# Patient Record
Sex: Female | Born: 1955 | Race: White | Hispanic: No | Marital: Single | Smoking: Never smoker
Health system: Southern US, Community
[De-identification: ages and names within clinical notes are randomized; demographics above are authoritative.]

## PROBLEM LIST (undated history)

## (undated) DIAGNOSIS — F419 Anxiety disorder, unspecified: Secondary | ICD-10-CM

## (undated) DIAGNOSIS — J45909 Unspecified asthma, uncomplicated: Secondary | ICD-10-CM

## (undated) DIAGNOSIS — E782 Mixed hyperlipidemia: Secondary | ICD-10-CM

## (undated) DIAGNOSIS — M810 Age-related osteoporosis without current pathological fracture: Secondary | ICD-10-CM

## (undated) DIAGNOSIS — E559 Vitamin D deficiency, unspecified: Secondary | ICD-10-CM

## (undated) DIAGNOSIS — N183 Chronic kidney disease, stage 3 unspecified: Secondary | ICD-10-CM

## (undated) DIAGNOSIS — E119 Type 2 diabetes mellitus without complications: Secondary | ICD-10-CM

## (undated) DIAGNOSIS — I10 Essential (primary) hypertension: Secondary | ICD-10-CM

## (undated) DIAGNOSIS — N393 Stress incontinence (female) (male): Secondary | ICD-10-CM

## (undated) DIAGNOSIS — K219 Gastro-esophageal reflux disease without esophagitis: Secondary | ICD-10-CM

## (undated) DIAGNOSIS — F32A Depression, unspecified: Secondary | ICD-10-CM

## (undated) DIAGNOSIS — F329 Major depressive disorder, single episode, unspecified: Secondary | ICD-10-CM

## (undated) HISTORY — DX: Vitamin D deficiency, unspecified: E55.9

## (undated) HISTORY — DX: Stress incontinence (female) (male): N39.3

## (undated) HISTORY — PX: MASTECTOMY: SHX3

## (undated) HISTORY — DX: Age-related osteoporosis without current pathological fracture: M81.0

## (undated) HISTORY — PX: BREAST LUMPECTOMY: SHX2

## (undated) HISTORY — DX: Essential (primary) hypertension: I10

## (undated) HISTORY — DX: Gastro-esophageal reflux disease without esophagitis: K21.9

## (undated) HISTORY — DX: Chronic kidney disease, stage 3 unspecified: N18.30

## (undated) HISTORY — DX: Unspecified asthma, uncomplicated: J45.909

## (undated) HISTORY — DX: Depression, unspecified: F32.A

## (undated) HISTORY — DX: Chronic kidney disease, stage 3 (moderate): N18.3

## (undated) HISTORY — DX: Type 2 diabetes mellitus without complications: E11.9

## (undated) HISTORY — DX: Major depressive disorder, single episode, unspecified: F32.9

## (undated) HISTORY — DX: Mixed hyperlipidemia: E78.2

## (undated) HISTORY — DX: Anxiety disorder, unspecified: F41.9

---

## 1998-03-12 ENCOUNTER — Other Ambulatory Visit: Admission: RE | Admit: 1998-03-12 | Discharge: 1998-03-12 | Payer: Self-pay | Admitting: Obstetrics and Gynecology

## 1998-03-28 ENCOUNTER — Other Ambulatory Visit: Admission: RE | Admit: 1998-03-28 | Discharge: 1998-03-28 | Payer: Self-pay | Admitting: Hematology and Oncology

## 1998-04-29 ENCOUNTER — Ambulatory Visit (HOSPITAL_COMMUNITY): Admission: RE | Admit: 1998-04-29 | Discharge: 1998-04-29 | Payer: Self-pay | Admitting: Obstetrics and Gynecology

## 1999-06-27 ENCOUNTER — Other Ambulatory Visit: Admission: RE | Admit: 1999-06-27 | Discharge: 1999-06-27 | Payer: Self-pay | Admitting: Family Medicine

## 1999-08-27 ENCOUNTER — Encounter: Payer: Self-pay | Admitting: Hematology and Oncology

## 1999-08-27 ENCOUNTER — Encounter: Admission: RE | Admit: 1999-08-27 | Discharge: 1999-08-27 | Payer: Self-pay | Admitting: Hematology and Oncology

## 2000-06-22 ENCOUNTER — Encounter: Admission: RE | Admit: 2000-06-22 | Discharge: 2000-06-22 | Payer: Self-pay | Admitting: Hematology and Oncology

## 2000-06-22 ENCOUNTER — Encounter: Payer: Self-pay | Admitting: Hematology and Oncology

## 2000-08-13 ENCOUNTER — Other Ambulatory Visit: Admission: RE | Admit: 2000-08-13 | Discharge: 2000-08-13 | Payer: Self-pay | Admitting: Family Medicine

## 2001-08-15 ENCOUNTER — Other Ambulatory Visit: Admission: RE | Admit: 2001-08-15 | Discharge: 2001-08-15 | Payer: Self-pay | Admitting: Family Medicine

## 2001-08-23 ENCOUNTER — Encounter: Payer: Self-pay | Admitting: Hematology and Oncology

## 2001-08-23 ENCOUNTER — Ambulatory Visit (HOSPITAL_COMMUNITY): Admission: RE | Admit: 2001-08-23 | Discharge: 2001-08-23 | Payer: Self-pay | Admitting: Hematology and Oncology

## 2007-06-28 ENCOUNTER — Other Ambulatory Visit: Payer: Self-pay

## 2007-06-29 ENCOUNTER — Inpatient Hospital Stay (HOSPITAL_COMMUNITY): Admission: AD | Admit: 2007-06-29 | Discharge: 2007-07-04 | Payer: Self-pay | Admitting: Psychiatry

## 2007-06-29 ENCOUNTER — Ambulatory Visit: Payer: Self-pay | Admitting: Psychiatry

## 2011-03-06 NOTE — Discharge Summary (Signed)
NAMELESLEA, VOWLES NO.:  [id number]   MEDICAL RECORD NO.:  [id number]          PATIENT TYPE:  IPS   LOCATION:  0402                          FACILITY:  [facility]   PHYSICIAN:  Anselm Jungling, MD  DATE OF BIRTH:  1955/12/08   DATE OF ADMISSION:  06/28/2007  DATE OF DISCHARGE:  07/04/2007                               DISCHARGE SUMMARY   IDENTIFYING DATA/REASON FOR ADMISSION:  This was an inpatient  psychiatric admission for Tyquisha, a 55 year old unmarried woman referred  by her therapist, in the emergency department.  She came to [place] with a  previous diagnosis of bipolar disorder, and possibly some  developmentally based cognitive deficiencies as well.  She was admitted  due to the suicidal and homicidal threats, and complaints of auditory  hallucinations.  She had been living in a particular group home in the  [place] area.  She came to [place] on a regimen of Celexa, Depakote,  trazodone, and Klonopin.  Dr. Cheree Ditto is her usual psychiatrist at [facility].  Please refer to the admission note for further  details pertaining to the symptoms, circumstances and history that led  to her hospitalization.   INITIAL DIAGNOSTIC IMPRESSION:  She was given initial AXIS I diagnoses  of psychosis not otherwise specified, mood disorder not otherwise  specified, rule out bipolar disorder.   MEDICAL/LABORATORY:  The patient was medically and physically assessed  by the psychiatric nurse practitioner.  She came to [place] with a history of  hypertension, hypercholesterolemia, and history of breast carcinoma.  She came to [place] on numerous nonpsychotropic medications and was continued  on these (see below).  There were no acute medical issues during her  stay.   HOSPITAL COURSE:  The patient was admitted to the adult inpatient  psychiatric service.  She presented as a well-nourished, well-developed  woman who was alert, fully oriented, pleasant, open, and quite  talkative.   She was a fairly good historian.  Her mood appeared neutral  with appropriate affect.  She denied any suicidal ideation in the  initial interview.  She made no overtly delusional statements.  She  described auditory and visual hallucinations, but did not appear to be  responding to internal stimuli.  She also complained of insomnia.   The patient was involved in therapeutic groups and activities, and  continued on her usual regimen of Depakote, Klonopin, and Risperdal.  Trazodone was used at bedtime to assist with sleep.   In looking into her group home situation, it appeared that she had had  various difficulties and conflicts with various staff and residents that  had led her to have the particular crisis that led to her hospital stay.  It did not appear that her mental status as we were seeing it was  significantly different from her baseline.   She was generally pleasant and cooperative throughout her inpatient  stay.  The only medication changes that were made involved moving her  divided doses of Depakote and Klonopin to bedtime, to improve her sleep.  Risperdal 1 mg in the evening was added  to address her complaints of  hallucinations.  The patient appeared to have more difficulty with these  sorts of things in the evening and at night time, as if sundowning,  and it was felt that low-dose Risperdal in the evening would be useful  to her.  Indeed, it did appear to be helpful.  The patient stated after  this was introduced that the medication helped my sleep and  everything.  She denied any further auditory hallucinations.   The casemanager worked closely with the patient and staff at her  previous group home towards a plan for her return there.  She appeared  appropriate for return on the seventh hospital day.   AFTERCARE:  The patient was to follow up at [facility]  with an appointment on July 05, 2007 at 9 a.m.   DISCHARGE MEDICATIONS:  1. Norvasc 2.5 mg  daily.  2. Os-Cal 500 mg t.i.d.  3. __________ 1 gram b.i.d.  4. Protonix 40 mg daily.  5. Celexa 20 mg t.i.d.  6. Nasonex 2 sprays daily.  7. Trazodone 50 mg q.h.s.  8. Welchol 625 mg b.i.d.  9. Xopenex inhaler as needed.  10.Zetia 10 mg daily.  11.VESIcare 10 mg daily.  12.Fosamax 70 mg weekly, next on July 07, 2007.  13.Depakote ER 750 mg at bed.  14.Klonopin 1 mg q.h.s.  15.Risperdal 1 mg q.p.m.   DISCHARGE DIAGNOSES:  AXIS I:  Bipolar disorder by history, currently  euthymic without psychotic features.  AXIS II:  Rule out developmental disability.  AXIS III:  History of hypertension, hypercholesterolemia, nasal  allergies, gastroesophageal reflux disease.  AXIS IV:  Stressors:  Severe.  AXIS V:  GAF on discharge 50.      Anselm Jungling, MD  Electronically Signed     SPB/MEDQ  D:  07/05/2007  T:  07/05/2007  Job:  [id number]

## 2011-07-31 LAB — BASIC METABOLIC PANEL
BUN: 8
CO2: 28
Calcium: 9.6
Chloride: 103
Creatinine, Ser: 0.72
GFR calc Af Amer: >60
GFR calc non Af Amer: >60
Glucose, Bld: 123 — ABNORMAL HIGH
Potassium: 3.8
Sodium: 137

## 2011-07-31 LAB — DIFFERENTIAL
Basophils Absolute: 0
Basophils Relative: 0
Eosinophils Absolute: 0.1
Eosinophils Relative: 2
Lymphocytes Relative: 43
Lymphs Abs: 2.7
Monocytes Absolute: 0.5
Monocytes Relative: 8
Neutro Abs: 3
Neutrophils Relative %: 47

## 2011-07-31 LAB — RAPID URINE DRUG SCREEN, HOSP PERFORMED
Amphetamines: NOT DETECTED
Barbiturates: NOT DETECTED
Benzodiazepines: NOT DETECTED
Cocaine: NOT DETECTED
Opiates: NOT DETECTED
Tetrahydrocannabinol: NOT DETECTED

## 2011-07-31 LAB — CBC
HCT: 36.3
Hemoglobin: 12.7
MCHC: 35
MCV: 91.2
Platelets: 128 — ABNORMAL LOW
RBC: 3.99
RDW: 12.6
WBC: 6.3

## 2011-07-31 LAB — URINE MICROSCOPIC-ADD ON

## 2011-07-31 LAB — URINALYSIS, ROUTINE W REFLEX MICROSCOPIC
Bilirubin Urine: NEGATIVE
Glucose, UA: NEGATIVE
Hgb urine dipstick: NEGATIVE
Ketones, ur: NEGATIVE
Nitrite: NEGATIVE
Protein, ur: NEGATIVE
Specific Gravity, Urine: 1.008
Urobilinogen, UA: 0.2
pH: 6.5

## 2011-07-31 LAB — VALPROIC ACID LEVEL
Valproic Acid Lvl: 65.1
Valproic Acid Lvl: 94.3

## 2011-07-31 LAB — ETHANOL: Alcohol, Ethyl (B): <5

## 2016-10-26 ENCOUNTER — Ambulatory Visit: Payer: Medicare Other | Admitting: Neurology

## 2016-11-12 ENCOUNTER — Ambulatory Visit (INDEPENDENT_AMBULATORY_CARE_PROVIDER_SITE_OTHER): Payer: Medicare Other | Admitting: Neurology

## 2016-11-12 ENCOUNTER — Encounter: Payer: Self-pay | Admitting: Neurology

## 2016-11-12 VITALS — BP 110/64 | HR 90 | Resp 16 | Ht 63.0 in | Wt 163.0 lb

## 2016-11-12 DIAGNOSIS — G2119 Other drug induced secondary parkinsonism: Secondary | ICD-10-CM

## 2016-11-12 DIAGNOSIS — R251 Tremor, unspecified: Secondary | ICD-10-CM | POA: Diagnosis not present

## 2016-11-12 DIAGNOSIS — R2689 Other abnormalities of gait and mobility: Secondary | ICD-10-CM | POA: Diagnosis not present

## 2016-11-12 DIAGNOSIS — M25561 Pain in right knee: Secondary | ICD-10-CM | POA: Diagnosis not present

## 2016-11-12 DIAGNOSIS — M25562 Pain in left knee: Secondary | ICD-10-CM

## 2016-11-12 NOTE — Progress Notes (Signed)
Subjective:    Patient ID: Donna Shepherd is a 60 y.o. female.  HPI     Huston Foley, MD, PhD [facility] [address], [address] [address] [place], [place] [address]  Dear Dr. Katrinka Blazing,  I saw your patient, Donna Shepherd, upon your kind request in my neurologic clinic today for initial consultation of her parkinsonism. The patient is accompanied by a caregiver from her group home today. As you know, Donna Shepherd is a 61 year old right-handed woman with an underlying medical history of mood disorder for which she is followed by psychiatry, hyperlipidemia, vitamin D deficiency, prediabetes, osteoporosis, reflux disease, allergic rhinitis, depression, anxiety, asthma, chronic kidney disease, hypothyroidism, and overweight state, who has a history of upper extremity tremors for several years. She carries a diagnosis of Parkinson's disease or parkinsonism according to your records. She has been on amantadine for this. Apparently she has been on amantadine for years. She's not able to provide her own history. Her caretaker has known her for about a year. She has previously removed from a different group home in [facility]. She has a brother in town who does not have a history of tremor and she does not report a family history of Parkinson's disease. She has a seventh grade education. She has no children. She does not smoke or drink alcohol. Of note, she is on multiple medications including psychotropic medications. She is on Trileptal, clonazepam, high-dose Seroquel, loxapine, Topamax, amantadine 100 mg twice daily, I reviewed her entire medication list in the folder that her caretaker brought today.  I also reviewed your office note from 09/23/2016, which you kindly included. Recent blood work through your office from 08/13/2016 was reviewed:  Free T4 was 0.85, borderline reduced, T3 was 40.48, reduced, CMP was unremarkable with the exception of creatinine at 1.3, CBC with differential  was unremarkable with the exception of borderline MCV at 102, magnesium level was unremarkable, urinalysis negative. She reports some falls. She has balance issues, tremors affect both hands and both arms, also her face. She also endorses bilateral knee pain, right more than left, per caretaker, she does not straighten out her right leg completely.  Her Past Medical History Is Significant For: Past Medical History:  Diagnosis Date  . Anxiety   . Asthma   . CKD (chronic kidney disease) stage 3, GFR 30-59 ml/min   . Depressive disorder   . Diabetes mellitus without complication (HCC)   . GERD (gastroesophageal reflux disease)   . Hypertension   . Mixed dyslipidemia   . Osteoporosis   . SUI (stress urinary incontinence, female)   . Vitamin D deficiency     Her Past Surgical History Is Significant For: Past Surgical History:  Procedure Laterality Date  . BREAST LUMPECTOMY Right   . MASTECTOMY Left     Her Family History Is Significant For: Family History  Problem Relation Age of Onset  . Diabetes Mother   . Heart disease Mother   . Hyperlipidemia Mother   . Hypertension Mother   . Depression Father   . Diabetes Father   . Heart disease Father   . Hyperlipidemia Father   . Hypertension Father     Her Social History Is Significant For: Social History   Social History  . Marital status: Single    Spouse name: N/A  . Number of children: 0  . Years of education: 7   Social History Main Topics  . Smoking status: Never Smoker  . Smokeless tobacco: Never  Used  . Alcohol use No  . Drug use: No  . Sexual activity: Not Asked   Other Topics Concern  . None   Social History Narrative   Drinks 1 soda about 3 times a week.     Her Allergies Are:  Allergies  Allergen Reactions  . Codeine   . Morphine And Related Rash  . Sulfa Antibiotics Rash  :   Her Current Medications Are:  Outpatient Encounter Prescriptions as of 11/12/2016  Medication Sig  . albuterol  (PROVENTIL HFA;VENTOLIN HFA) 108 (90 Base) MCG/ACT inhaler Inhale into the lungs every 6 (six) hours as needed for wheezing or shortness of breath.  Marland Kitchen. alendronate (FOSAMAX) 35 MG tablet Take 35 mg by mouth every 7 (seven) days. Take with a full glass of water on an empty stomach.  Marland Kitchen. amantadine (SYMMETREL) 100 MG capsule Take 100 mg by mouth 2 (two) times daily.  Marland Kitchen. aspirin 81 MG tablet Take 81 mg by mouth daily.  Marland Kitchen. CALCIUM-VITAMIN D PO Take by mouth.  . cholecalciferol (VITAMIN D) 1000 units tablet Take 1,000 Units by mouth daily.  . clonazePAM (KLONOPIN) 0.5 MG tablet Take 0.5 mg by mouth. Take 1 tab in the morning and 2 tabs at night  . diltiazem (TIAZAC) 120 MG 24 hr capsule Take 120 mg by mouth daily.  Marland Kitchen. FLUoxetine (PROZAC) 20 MG capsule Take 40 mg by mouth daily.  Marland Kitchen. ibuprofen (ADVIL,MOTRIN) 600 MG tablet Take 600 mg by mouth 3 (three) times daily.  Marland Kitchen. levothyroxine (SYNTHROID, LEVOTHROID) 25 MCG tablet Take 25 mcg by mouth daily before breakfast.  . linaclotide (LINZESS) 290 MCG CAPS capsule Take 290 mcg by mouth daily before breakfast.  . loxapine (LOXITANE) 10 MG capsule Take 10 mg by mouth 2 (two) times daily.  Marland Kitchen. lubiprostone (AMITIZA) 8 MCG capsule Take 8 mcg by mouth 2 (two) times daily with a meal.  . mirabegron ER (MYRBETRIQ) 25 MG TB24 tablet Take 25 mg by mouth daily.  . nitrofurantoin, macrocrystal-monohydrate, (MACROBID) 100 MG capsule Take 100 mg by mouth daily.  . Oxcarbazepine (TRILEPTAL) 300 MG tablet Take 450 mg by mouth 2 (two) times daily.  . QUEtiapine (SEROQUEL) 100 MG tablet Take 100 mg by mouth. Take 1 tab in the morning, 1 tab in the afternoon, and 3 tab at bedtime.  . topiramate (TOPAMAX) 25 MG tablet Take 25 mg by mouth 2 (two) times daily.  Marland Kitchen. trimethoprim (TRIMPEX) 100 MG tablet Take 100 mg by mouth daily.   No facility-administered encounter medications on file as of 11/12/2016.   :   Review of Systems:  Out of a complete 14 point review of systems, all are  reviewed and negative with the exception of these symptoms as listed below:  Review of Systems  Neurological:       Caregiver states that patient has had increased trouble with balance and gait. Notice tremors in arms and hands that have progressed in the last year.     Objective:  Neurologic Exam  Physical Exam Physical Examination:   Vitals:   11/12/16 1013  BP: 110/64  Pulse: 90  Resp: 16    General Examination: The patient is a very pleasant 61 y.o. female in no acute distress, She is anxious appearing. She has difficulty following commands, she can mimic better than follow simple commands.  HEENT: Normocephalic, atraumatic, pupils are equal, round and reactive to light and accommodation. Funduscopic exam is not possible as she does not keep her eyes still. She has  no clear nystagmus but does have difficulty tracking. Face is symmetric to perhaps mildly masked, normal facial sensation is noted. Hearing appears to be intact. She has a softer voice. She has no voice tremor. She has an intermittent lower jaw and lip tremor. She has no significant nuchal rigidity. Oropharynx exam reveals severe mouth dryness, tongue protrudes centrally and palate elevates symmetrically.   Chest: Clear to auscultation without wheezing, rhonchi or crackles noted.  Heart: S1+S2+0, regular and normal without murmurs, rubs or gallops noted.   Abdomen: Soft, non-tender and non-distended with normal bowel sounds appreciated on auscultation.  Extremities: There is trace pitting edema in the distal lower extremities bilaterally.   Skin:   Is dry, no other abnormalities noted.   Musculoskeletal: exam reveals: Bilateral knee pain, right more than left, she has decreased in range of motion bilaterally in the right leg more than left. She cannot fully extend her right leg. She has tenderness on palpation in the medial aspect of the right more than left knee.   Neurologically:  Mental status: The patient is  awake, alert and oriented in all 2 spheres.her memory, attention, language and fund of knowledge are abnormal. She's not able to give her own history. She is not sure how long she has had tremors and balance problems. Mood and affect are difficult to assess. She is cooperative with the exam but is not able to follow more than simple commands. Cranial nerves II - XII are as described above under HEENT exam. In addition: shoulder shrug is normal with equal shoulder height noted. Motor exam: Normal bulk, global strength is 5 out of 5, tone is very mildly increased in the upper extremities. She has a mild degree of intermittent resting tremor in both upper extremities, she has a mild to moderate postural tremor in both upper extremities and mild action tremor in both upper extremities. She has no lower extremity tremor. She has impaired fine motor skills throughout but these are difficult to assess as she cannot follow complex commands. She stands with difficulty and has knee pain. She does not push through both knees, right side is worse than left. She walks with small steps, posture is mild to moderately stooped, she has decrease in arm swing bilaterally. Reflexes are 1-2+ throughout. Balance is mildly impaired. She turns in 3 steps. Sensory exam: intact to light touch and otherwise not fully reliable.   Assessment and Plan:   Assessment and Plan:  In summary, TRESIA REVOLORIO is a very pleasant 61 y.o.-year old female with an underlying Complex medical history of mood disorder for which she is followed by psychiatry, hyperlipidemia, vitamin D deficiency, prediabetes, osteoporosis, reflux disease, allergic rhinitis, depression, anxiety, asthma, chronic kidney disease, hypothyroidism, and overweight state, who presents for consultation of her history of parkinsonism. On examination she has mild parkinsonism, versus tremor, likely drug-induced in her case. She has no one-sided lateralization and no classic  history for Parkinson's disease. I had a long discussion with the patient and particularly her caretaker today explaining my findings and the fact that patient is on multiple psychotropic medication as well as high-dose Seroquel which is a likely contributor to her parkinsonism. In the past, she may have been on lithium per caretaker but she is not fully sure. She has been on Topamax for tremor control as I understand and also on amantadine for parkinsonism. I'm not sure that this is beneficial for her at this time especially since amantadine can cause significant side effects including dry  mouth, hallucinations, balance problems, blurry vision, and urinary problems. Per caretaker, patient has occasional hallucinations. On examination she does have a significantly dry mouth. I would not recommend any additional medication trials of medications at this time and if anything I would recommend that she be tapered off of amantadine. She is encouraged to discuss this with you. She can also discuss this with her psychiatrist. She has no other focal neurological findings, she is advised to stay well-hydrated and change positions slowly. she has bilateral knee pain, right more than left and has some tenderness on palpation of her right knee. She is encouraged to discuss with you the possibility of seeing an orthopedic doctor for this. From my end of things I can see her back on an as-needed basis. I answered all her questions today and the patient and her caretaker were in agreement. Thank you very much for allowing me to participate in the care of this nice patient. If I can be of any further assistance to you please do not hesitate to call me at [phone].  Sincerely,   Huston Foley, MD, PhD

## 2016-11-12 NOTE — Patient Instructions (Signed)
Your tremors and parkinsonian symptoms may be secondary to medication effect. I would not recommend any additional medication for management of your tremor or parkinsonism. I would like for you to discuss with your primary care physician or psychiatrist whether he could taper off the amantadine. This medication can cause side effects including dry mouth, hallucinations, balance problems, all of which you are currently experiencing.  For your knee pain I recommend that you seek consultation with an orthopedic surgeon, ask your primary care physician about a referral.

## 2017-04-15 ENCOUNTER — Other Ambulatory Visit: Payer: Self-pay | Admitting: Internal Medicine

## 2017-04-15 DIAGNOSIS — Z1231 Encounter for screening mammogram for malignant neoplasm of breast: Secondary | ICD-10-CM

## 2018-07-07 ENCOUNTER — Ambulatory Visit: Payer: Medicare Other | Admitting: Neurology

## 2018-07-08 ENCOUNTER — Encounter: Payer: Self-pay | Admitting: Neurology

## 2018-08-04 ENCOUNTER — Other Ambulatory Visit: Payer: Self-pay | Admitting: Sports Medicine

## 2018-08-04 DIAGNOSIS — S92324A Nondisplaced fracture of second metatarsal bone, right foot, initial encounter for closed fracture: Secondary | ICD-10-CM

## 2018-08-08 ENCOUNTER — Other Ambulatory Visit: Payer: Self-pay

## 2018-08-29 ENCOUNTER — Other Ambulatory Visit: Payer: Self-pay

## 2018-08-31 ENCOUNTER — Other Ambulatory Visit: Payer: Self-pay

## 2018-09-01 ENCOUNTER — Ambulatory Visit
Admission: RE | Admit: 2018-09-01 | Discharge: 2018-09-01 | Disposition: A | Discharge status: Home / Self Care | Payer: Medicare Other | Source: Ambulatory Visit | Attending: Sports Medicine | Admitting: Sports Medicine

## 2018-09-01 DIAGNOSIS — S92324A Nondisplaced fracture of second metatarsal bone, right foot, initial encounter for closed fracture: Secondary | ICD-10-CM

## 2019-02-06 ENCOUNTER — Other Ambulatory Visit: Payer: Self-pay | Admitting: Physician Assistant

## 2019-02-06 DIAGNOSIS — Z1231 Encounter for screening mammogram for malignant neoplasm of breast: Secondary | ICD-10-CM

## 2019-04-12 ENCOUNTER — Ambulatory Visit
Admission: RE | Admit: 2019-04-12 | Discharge: 2019-04-12 | Disposition: A | Discharge status: Home / Self Care | Payer: Medicare Other | Source: Ambulatory Visit | Attending: Physician Assistant | Admitting: Physician Assistant

## 2019-04-12 ENCOUNTER — Other Ambulatory Visit: Payer: Self-pay

## 2019-04-12 DIAGNOSIS — Z1231 Encounter for screening mammogram for malignant neoplasm of breast: Secondary | ICD-10-CM

## 2019-09-13 ENCOUNTER — Emergency Department (HOSPITAL_COMMUNITY): Payer: Medicare Other

## 2019-09-13 ENCOUNTER — Other Ambulatory Visit: Payer: Self-pay

## 2019-09-13 ENCOUNTER — Observation Stay (HOSPITAL_COMMUNITY)
Admission: EM | Admit: 2019-09-13 | Discharge: 2019-09-14 | Disposition: A | Discharge status: Skilled Nursing Facility | Payer: Medicare Other | Attending: Neurosurgery | Admitting: Neurosurgery

## 2019-09-13 ENCOUNTER — Encounter (HOSPITAL_COMMUNITY): Payer: Self-pay | Admitting: Emergency Medicine

## 2019-09-13 DIAGNOSIS — F329 Major depressive disorder, single episode, unspecified: Secondary | ICD-10-CM | POA: Insufficient documentation

## 2019-09-13 DIAGNOSIS — R410 Disorientation, unspecified: Secondary | ICD-10-CM | POA: Diagnosis not present

## 2019-09-13 DIAGNOSIS — Z7982 Long term (current) use of aspirin: Secondary | ICD-10-CM | POA: Insufficient documentation

## 2019-09-13 DIAGNOSIS — M810 Age-related osteoporosis without current pathological fracture: Secondary | ICD-10-CM | POA: Diagnosis not present

## 2019-09-13 DIAGNOSIS — I129 Hypertensive chronic kidney disease with stage 1 through stage 4 chronic kidney disease, or unspecified chronic kidney disease: Secondary | ICD-10-CM | POA: Insufficient documentation

## 2019-09-13 DIAGNOSIS — W010XXA Fall on same level from slipping, tripping and stumbling without subsequent striking against object, initial encounter: Secondary | ICD-10-CM | POA: Diagnosis not present

## 2019-09-13 DIAGNOSIS — F39 Unspecified mood [affective] disorder: Secondary | ICD-10-CM | POA: Insufficient documentation

## 2019-09-13 DIAGNOSIS — W19XXXA Unspecified fall, initial encounter: Secondary | ICD-10-CM | POA: Diagnosis not present

## 2019-09-13 DIAGNOSIS — K219 Gastro-esophageal reflux disease without esophagitis: Secondary | ICD-10-CM | POA: Diagnosis not present

## 2019-09-13 DIAGNOSIS — F419 Anxiety disorder, unspecified: Secondary | ICD-10-CM | POA: Diagnosis not present

## 2019-09-13 DIAGNOSIS — E782 Mixed hyperlipidemia: Secondary | ICD-10-CM | POA: Diagnosis not present

## 2019-09-13 DIAGNOSIS — E785 Hyperlipidemia, unspecified: Secondary | ICD-10-CM | POA: Insufficient documentation

## 2019-09-13 DIAGNOSIS — J45909 Unspecified asthma, uncomplicated: Secondary | ICD-10-CM | POA: Insufficient documentation

## 2019-09-13 DIAGNOSIS — E1122 Type 2 diabetes mellitus with diabetic chronic kidney disease: Secondary | ICD-10-CM | POA: Diagnosis not present

## 2019-09-13 DIAGNOSIS — F0390 Unspecified dementia without behavioral disturbance: Secondary | ICD-10-CM | POA: Diagnosis not present

## 2019-09-13 DIAGNOSIS — S0081XA Abrasion of other part of head, initial encounter: Secondary | ICD-10-CM | POA: Diagnosis not present

## 2019-09-13 DIAGNOSIS — N183 Chronic kidney disease, stage 3 unspecified: Secondary | ICD-10-CM | POA: Insufficient documentation

## 2019-09-13 DIAGNOSIS — S062X0A Diffuse traumatic brain injury without loss of consciousness, initial encounter: Secondary | ICD-10-CM

## 2019-09-13 DIAGNOSIS — Z20828 Contact with and (suspected) exposure to other viral communicable diseases: Secondary | ICD-10-CM | POA: Diagnosis not present

## 2019-09-13 DIAGNOSIS — Z882 Allergy status to sulfonamides status: Secondary | ICD-10-CM | POA: Insufficient documentation

## 2019-09-13 DIAGNOSIS — N393 Stress incontinence (female) (male): Secondary | ICD-10-CM | POA: Diagnosis not present

## 2019-09-13 DIAGNOSIS — Z8249 Family history of ischemic heart disease and other diseases of the circulatory system: Secondary | ICD-10-CM | POA: Insufficient documentation

## 2019-09-13 DIAGNOSIS — Z79899 Other long term (current) drug therapy: Secondary | ICD-10-CM | POA: Diagnosis not present

## 2019-09-13 DIAGNOSIS — E559 Vitamin D deficiency, unspecified: Secondary | ICD-10-CM | POA: Insufficient documentation

## 2019-09-13 DIAGNOSIS — Z791 Long term (current) use of non-steroidal anti-inflammatories (NSAID): Secondary | ICD-10-CM | POA: Diagnosis not present

## 2019-09-13 DIAGNOSIS — Z833 Family history of diabetes mellitus: Secondary | ICD-10-CM | POA: Insufficient documentation

## 2019-09-13 DIAGNOSIS — S06300A Unspecified focal traumatic brain injury without loss of consciousness, initial encounter: Principal | ICD-10-CM | POA: Insufficient documentation

## 2019-09-13 DIAGNOSIS — S0636AA Traumatic hemorrhage of cerebrum, unspecified, with loss of consciousness status unknown, initial encounter: Secondary | ICD-10-CM | POA: Diagnosis present

## 2019-09-13 DIAGNOSIS — R519 Headache, unspecified: Secondary | ICD-10-CM | POA: Insufficient documentation

## 2019-09-13 DIAGNOSIS — Z885 Allergy status to narcotic agent status: Secondary | ICD-10-CM | POA: Insufficient documentation

## 2019-09-13 DIAGNOSIS — S06369A Traumatic hemorrhage of cerebrum, unspecified, with loss of consciousness of unspecified duration, initial encounter: Secondary | ICD-10-CM | POA: Diagnosis present

## 2019-09-13 LAB — COMPREHENSIVE METABOLIC PANEL
ALT: 40 U/L (ref 0–44)
AST: 31 U/L (ref 15–41)
Albumin: 4.4 g/dL (ref 3.5–5.0)
Alkaline Phosphatase: 71 U/L (ref 38–126)
Anion gap: 11 (ref 5–15)
BUN: 13 mg/dL (ref 8–23)
CO2: 20 mmol/L — ABNORMAL LOW (ref 22–32)
Calcium: 9.9 mg/dL (ref 8.9–10.3)
Chloride: 105 mmol/L (ref 98–111)
Creatinine, Ser: 1.4 mg/dL — ABNORMAL HIGH (ref 0.44–1.00)
GFR calc Af Amer: 46 mL/min — ABNORMAL LOW (ref >60)
GFR calc non Af Amer: 40 mL/min — ABNORMAL LOW (ref >60)
Glucose, Bld: 130 mg/dL — ABNORMAL HIGH (ref 70–99)
Potassium: 4.3 mmol/L (ref 3.5–5.1)
Sodium: 136 mmol/L (ref 135–145)
Total Bilirubin: 0.6 mg/dL (ref 0.3–1.2)
Total Protein: 7.2 g/dL (ref 6.5–8.1)

## 2019-09-13 LAB — CBC WITH DIFFERENTIAL/PLATELET
Abs Immature Granulocytes: 0.05 10*3/uL (ref 0.00–0.07)
Basophils Absolute: 0 10*3/uL (ref 0.0–0.1)
Basophils Relative: 0 %
Eosinophils Absolute: 0 10*3/uL (ref 0.0–0.5)
Eosinophils Relative: 0 %
HCT: 38.3 % (ref 36.0–46.0)
Hemoglobin: 12.6 g/dL (ref 12.0–15.0)
Immature Granulocytes: 1 %
Lymphocytes Relative: 10 %
Lymphs Abs: 1.1 10*3/uL (ref 0.7–4.0)
MCH: 33.2 pg (ref 26.0–34.0)
MCHC: 32.9 g/dL (ref 30.0–36.0)
MCV: 101.1 fL — ABNORMAL HIGH (ref 80.0–100.0)
Monocytes Absolute: 0.9 10*3/uL (ref 0.1–1.0)
Monocytes Relative: 9 %
Neutro Abs: 8.5 10*3/uL — ABNORMAL HIGH (ref 1.7–7.7)
Neutrophils Relative %: 80 %
Platelets: 140 10*3/uL — ABNORMAL LOW (ref 150–400)
RBC: 3.79 MIL/uL — ABNORMAL LOW (ref 3.87–5.11)
RDW: 13.2 % (ref 11.5–15.5)
WBC: 10.6 10*3/uL — ABNORMAL HIGH (ref 4.0–10.5)
nRBC: 0 % (ref 0.0–0.2)

## 2019-09-13 MED ORDER — TETANUS-DIPHTH-ACELL PERTUSSIS 5-2.5-18.5 LF-MCG/0.5 IM SUSP
0.5000 mL | Freq: Once | INTRAMUSCULAR | Status: AC
  Administered 2019-09-13: 0.5 mL via INTRAMUSCULAR
  Filled 2019-09-13: qty 0.5

## 2019-09-13 MED ORDER — FLUOXETINE HCL 20 MG PO CAPS
40.0000 mg | ORAL_CAPSULE | Freq: Every day | ORAL | Status: DC
  Administered 2019-09-14: 40 mg via ORAL
  Filled 2019-09-13: qty 2

## 2019-09-13 MED ORDER — ATORVASTATIN CALCIUM 10 MG PO TABS: 20.0000 mg | ORAL_TABLET | Freq: Every day | ORAL | Status: DC

## 2019-09-13 MED ORDER — ACETAMINOPHEN 500 MG PO TABS: 500.0000 mg | ORAL_TABLET | Freq: Three times a day (TID) | ORAL | Status: DC | PRN

## 2019-09-13 MED ORDER — SODIUM CHLORIDE 0.9 % IV SOLN
INTRAVENOUS | Status: DC
  Administered 2019-09-14: 04:00:00 via INTRAVENOUS
  Filled 2019-09-13 (×4): qty 1000

## 2019-09-13 MED ORDER — DILTIAZEM HCL ER COATED BEADS 120 MG PO CP24
120.0000 mg | ORAL_CAPSULE | Freq: Every day | ORAL | Status: DC
  Administered 2019-09-14: 120 mg via ORAL
  Filled 2019-09-13: qty 1

## 2019-09-13 MED ORDER — ALBUTEROL SULFATE (2.5 MG/3ML) 0.083% IN NEBU: 3.0000 mL | INHALATION_SOLUTION | Freq: Four times a day (QID) | RESPIRATORY_TRACT | Status: DC | PRN

## 2019-09-13 MED ORDER — LEVOTHYROXINE SODIUM 25 MCG PO TABS
25.0000 ug | ORAL_TABLET | Freq: Every day | ORAL | Status: DC
  Filled 2019-09-13: qty 1

## 2019-09-13 MED ORDER — QUETIAPINE FUMARATE 100 MG PO TABS
100.0000 mg | ORAL_TABLET | Freq: Three times a day (TID) | ORAL | Status: DC
  Administered 2019-09-14: 100 mg via ORAL
  Filled 2019-09-13: qty 1

## 2019-09-13 MED ORDER — TRAMADOL HCL 50 MG PO TABS: 50.0000 mg | ORAL_TABLET | Freq: Four times a day (QID) | ORAL | Status: DC | PRN

## 2019-09-13 MED ORDER — ACETAMINOPHEN 325 MG PO TABS
650.0000 mg | ORAL_TABLET | Freq: Once | ORAL | Status: AC
  Administered 2019-09-13: 19:00:00 650 mg via ORAL
  Filled 2019-09-13: qty 2

## 2019-09-13 MED ORDER — ONDANSETRON HCL 4 MG PO TABS: 4.0000 mg | ORAL_TABLET | Freq: Four times a day (QID) | ORAL | Status: DC | PRN

## 2019-09-13 MED ORDER — ONDANSETRON HCL 4 MG/2ML IJ SOLN: 4.0000 mg | Freq: Four times a day (QID) | INTRAMUSCULAR | Status: DC | PRN

## 2019-09-13 NOTE — ED Notes (Signed)
[facility] contacted and paperwork printed  

## 2019-09-13 NOTE — ED Triage Notes (Signed)
Patient BIB caregiver from assisted living facility, reports trip and fall hitting head on cement. Abrasion and bruising noted to left forehead. Baseline per caregiver. Denies taking blood thinners.

## 2019-09-13 NOTE — ED Notes (Signed)
Patient transported to X-ray 

## 2019-09-13 NOTE — ED Notes (Signed)
ED TO INPATIENT HANDOFF REPORT  Name/Age/Gender Donna Shepherd 63 y.o. female  Code Status    Code Status Orders  (From admission, onward)         Start     Ordered   09/13/19 2146  Full code  Continuous     09/13/19 2149        Code Status History    This patient has a current code status but no historical code status.   Advance Care Planning Activity      Home/SNF/Other Home  Chief Complaint Fall, Head LAc  Level of Care/Admitting Diagnosis ED Disposition    ED Disposition Condition Comment   Admit  Hospital Area: [facility] [[id number]]  Level of Care: Med-Surg [16]  Covid Evaluation: Asymptomatic Screening Protocol (No Symptoms)  Diagnosis: Traumatic intracerebral hemorrhage [facility]) [[id number]]  Admitting Physician: Coletta Memos [[id number]]  Attending Physician: Coletta Memos [[id number]]  Bed request comments: 4np  PT Class (Do Not Modify): Observation [104]  PT Acc Code (Do Not Modify): Observation [10022]       Medical History Past Medical History:  Diagnosis Date  . Anxiety   . Asthma   . CKD (chronic kidney disease) stage 3, GFR 30-59 ml/min   . Depressive disorder   . Diabetes mellitus without complication (HCC)   . GERD (gastroesophageal reflux disease)   . Hypertension   . Mixed dyslipidemia   . Osteoporosis   . SUI (stress urinary incontinence, female)   . Vitamin D deficiency     Allergies Allergies  Allergen Reactions  . Codeine   . Morphine And Related Rash  . Sulfa Antibiotics Rash    IV Location/Drains/Wounds Patient Lines/Drains/Airways Status   Active Line/Drains/Airways    Name:   Placement date:   Placement time:   Site:   Days:   Peripheral IV 09/13/19 Right Antecubital   09/13/19    1940    Antecubital   less than 1   Peripheral IV 09/13/19 Right Forearm   09/13/19    1946    Forearm   less than 1          Labs/Imaging Results for orders placed or performed during the hospital encounter of 09/13/19 (from the  past 48 hour(s))  CBC with Differential     Status: Abnormal   Collection Time: 09/13/19  7:45 PM  Result Value Ref Range   WBC 10.6 (H) 4.0 - 10.5 K/uL   RBC 3.79 (L) 3.87 - 5.11 MIL/uL   Hemoglobin 12.6 12.0 - 15.0 g/dL   HCT 30.8 65.7 - 84.6 %   MCV 101.1 (H) 80.0 - 100.0 fL   MCH 33.2 26.0 - 34.0 pg   MCHC 32.9 30.0 - 36.0 g/dL   RDW 96.2 95.2 - 84.1 %   Platelets 140 (L) 150 - 400 K/uL    Comment: SPECIMEN CHECKED FOR CLOTS   nRBC 0.0 0.0 - 0.2 %   Neutrophils Relative % 80 %   Neutro Abs 8.5 (H) 1.7 - 7.7 K/uL   Lymphocytes Relative 10 %   Lymphs Abs 1.1 0.7 - 4.0 K/uL   Monocytes Relative 9 %   Monocytes Absolute 0.9 0.1 - 1.0 K/uL   Eosinophils Relative 0 %   Eosinophils Absolute 0.0 0.0 - 0.5 K/uL   Basophils Relative 0 %   Basophils Absolute 0.0 0.0 - 0.1 K/uL   Immature Granulocytes 1 %   Abs Immature Granulocytes 0.05 0.00 - 0.07 K/uL    Comment: Performed  at [facility], [address] [place], [place] [address]  Comprehensive metabolic panel     Status: Abnormal   Collection Time: 09/13/19  7:45 PM  Result Value Ref Range   Sodium 136 135 - 145 mmol/L   Potassium 4.3 3.5 - 5.1 mmol/L   Chloride 105 98 - 111 mmol/L   CO2 20 (L) 22 - 32 mmol/L   Glucose, Bld 130 (H) 70 - 99 mg/dL   BUN 13 8 - 23 mg/dL   Creatinine, Ser 6.571.40 (H) 0.44 - 1.00 mg/dL   Calcium 9.9 8.9 - 84.610.3 mg/dL   Total Protein 7.2 6.5 - 8.1 g/dL   Albumin 4.4 3.5 - 5.0 g/dL   AST 31 15 - 41 U/L   ALT 40 0 - 44 U/L   Alkaline Phosphatase 71 38 - 126 U/L   Total Bilirubin 0.6 0.3 - 1.2 mg/dL   GFR calc non Af Amer 40 (L) >60 mL/min   GFR calc Af Amer 46 (L) >60 mL/min   Anion gap 11 5 - 15    Comment: Performed at [facility], [address] [place], [place] [address]   Dg Chest 2 View  Result Date: 09/13/2019 CLINICAL DATA:  Fall, confusion EXAM: CHEST - 2 VIEW COMPARISON:  05/16/2004 chest radiograph. FINDINGS: Surgical clips throughout the left  breast. Stable cardiomediastinal silhouette with normal heart size. No pneumothorax. No pleural effusion. Minimal platelike scarring versus atelectasis in lower left lung. No pulmonary edema. No acute consolidative airspace disease. Sclerotic lesion in the proximal right femoral shaft is stable since 2005 radiographs, considered benign. No displaced fractures in the visualized chest. IMPRESSION: Minimal platelike scarring versus atelectasis in the lower left lung. Otherwise no active cardiopulmonary disease. Electronically Signed   By: Delbert PhenixJason A Poff M.D.   On: 09/13/2019 16:58   Dg Pelvis 1-2 Views  Result Date: 09/13/2019 CLINICAL DATA:  Patient tripped and fell. Abrasion and bruising noted over the left forehead. EXAM: PELVIS - 1-2 VIEW COMPARISON:  None. FINDINGS: The bones appear mildly demineralized. There is no evidence of acute pelvic fracture or sacroiliac joint diastasis. The left hip is externally rotated. No definite proximal femur fracture or dislocation. The soft tissues appear unremarkable. IMPRESSION: No acute osseous findings demonstrated within the pelvis. The left hip is externally rotated and suboptimally evaluated. Electronically Signed   By: Carey BullocksWilliam  Veazey M.D.   On: 09/13/2019 16:58   Ct Head Wo Contrast  Result Date: 09/13/2019 CLINICAL DATA:  Head trauma, headache.  Fall. EXAM: CT HEAD WITHOUT CONTRAST CT CERVICAL SPINE WITHOUT CONTRAST TECHNIQUE: Multidetector CT imaging of the head and cervical spine was performed following the standard protocol without intravenous contrast. Multiplanar CT image reconstructions of the cervical spine were also generated. COMPARISON:  Radiographs of the cervical spine 08/16/2004 FINDINGS: CT HEAD FINDINGS Brain: There is a 7 mm hemorrhagic parenchymal contusion within the anterior left frontal lobe (series 3, image 24) (series 6, image 22). No demarcated cortical infarction. No evidence of intracranial mass. No midline shift or extra-axial fluid  collection. Mild scattered ill-defined hypoattenuation within the cerebral white matter is nonspecific, but consistent with chronic small vessel ischemic disease. Cerebral volume is normal for age. Vascular: No hyperdense vessel.  Atherosclerotic calcifications. Skull: Normal. Negative for fracture or focal lesion. Sinuses/Orbits: Visualized orbits demonstrate no acute abnormality. Minimal mucosal thickening within the inferior right maxillary sinus. Other: Prominent left frontal scalp to left maxillofacial hematoma. CT CERVICAL SPINE FINDINGS Alignment: Straightening of the expected  cervical lordosis. Skull base and vertebrae: The basion-dental and atlanto-dental intervals are maintained.No evidence of acute fracture to the cervical spine. Soft tissues and spinal canal: No prevertebral fluid or swelling. No visible canal hematoma. Disc levels: C6-C7 posterior disc osteophyte. No high-grade bony spinal canal stenosis at any level. Upper chest: No consolidation within the imaged lung apices. No visible pneumothorax. These results were called by telephone at the time of interpretation on 09/13/2019 at 7:24 pm to provider [facility] , who verbally acknowledged these results. IMPRESSION: Head CT: 1. 7 mm acute hemorrhagic parenchymal contusion within the anterior left frontal lobe. 2. Prominent left frontal scalp to left maxillofacial hematoma. 3. Mild chronic small vessel ischemic disease. Cervical Spine CT: No evidence of acute fracture to the cervical spine. Electronically Signed   By: Kellie Simmering DO   On: 09/13/2019 19:25   Ct Cervical Spine Wo Contrast  Result Date: 09/13/2019 CLINICAL DATA:  Head trauma, headache.  Fall. EXAM: CT HEAD WITHOUT CONTRAST CT CERVICAL SPINE WITHOUT CONTRAST TECHNIQUE: Multidetector CT imaging of the head and cervical spine was performed following the standard protocol without intravenous contrast. Multiplanar CT image reconstructions of the cervical spine were also  generated. COMPARISON:  Radiographs of the cervical spine 08/16/2004 FINDINGS: CT HEAD FINDINGS Brain: There is a 7 mm hemorrhagic parenchymal contusion within the anterior left frontal lobe (series 3, image 24) (series 6, image 22). No demarcated cortical infarction. No evidence of intracranial mass. No midline shift or extra-axial fluid collection. Mild scattered ill-defined hypoattenuation within the cerebral white matter is nonspecific, but consistent with chronic small vessel ischemic disease. Cerebral volume is normal for age. Vascular: No hyperdense vessel.  Atherosclerotic calcifications. Skull: Normal. Negative for fracture or focal lesion. Sinuses/Orbits: Visualized orbits demonstrate no acute abnormality. Minimal mucosal thickening within the inferior right maxillary sinus. Other: Prominent left frontal scalp to left maxillofacial hematoma. CT CERVICAL SPINE FINDINGS Alignment: Straightening of the expected cervical lordosis. Skull base and vertebrae: The basion-dental and atlanto-dental intervals are maintained.No evidence of acute fracture to the cervical spine. Soft tissues and spinal canal: No prevertebral fluid or swelling. No visible canal hematoma. Disc levels: C6-C7 posterior disc osteophyte. No high-grade bony spinal canal stenosis at any level. Upper chest: No consolidation within the imaged lung apices. No visible pneumothorax. These results were called by telephone at the time of interpretation on 09/13/2019 at 7:24 pm to provider [facility] , who verbally acknowledged these results. IMPRESSION: Head CT: 1. 7 mm acute hemorrhagic parenchymal contusion within the anterior left frontal lobe. 2. Prominent left frontal scalp to left maxillofacial hematoma. 3. Mild chronic small vessel ischemic disease. Cervical Spine CT: No evidence of acute fracture to the cervical spine. Electronically Signed   By: Kellie Simmering DO   On: 09/13/2019 19:25   Dg Hip Unilat W Or Wo Pelvis 2-3 Views  Left  Result Date: 09/13/2019 CLINICAL DATA:  Fall EXAM: DG HIP (WITH OR WITHOUT PELVIS) 2-3V LEFT COMPARISON:  None. FINDINGS: No fracture or dislocation is seen. The left hip joint space is preserved. Visualized bony pelvis appears intact. IMPRESSION: Negative. Electronically Signed   By: Julian Hy M.D.   On: 09/13/2019 19:04    Pending Labs Unresulted Labs (From admission, onward)    Start     Ordered   09/13/19 2146  HIV Antibody (routine testing w rflx)  (HIV Antibody (Routine testing w reflex) panel)  Once,   STAT     09/13/19 2149   09/13/19 1956  SARS  CORONAVIRUS 2 (TAT 6-24 HRS) Nasopharyngeal Nasopharyngeal Swab  (Asymptomatic/Tier 3)  Once,   STAT    Question Answer Comment  Is this test for diagnosis or screening Screening   Symptomatic for COVID-19 as defined by CDC No   Hospitalized for COVID-19 No   Admitted to ICU for COVID-19 No   Previously tested for COVID-19 No   Resident in a congregate (group) care setting Yes   Employed in healthcare setting No   Pregnant No      09/13/19 1956          Vitals/Pain Today's Vitals   09/13/19 1605 09/13/19 1609 09/13/19 1948 09/13/19 2125  BP: 125/70  (!) 142/84 122/66  Pulse: 69  74 68  Resp: 20  (!) 24 18  Temp:  98 F (36.7 C)    TempSrc:  Oral    SpO2: 100%  100% 100%  Weight: 69.4 kg     Height: 5' 5.5" (1.664 m)       Isolation Precautions No active isolations  Medications Medications  acetaminophen (TYLENOL) tablet 500 mg (has no administration in time range)  traMADol (ULTRAM) tablet 50 mg (has no administration in time range)  atorvastatin (LIPITOR) tablet 20 mg (has no administration in time range)  diltiazem (TIAZAC) 24 hr capsule 120 mg (has no administration in time range)  FLUoxetine (PROZAC) capsule 40 mg (has no administration in time range)  QUEtiapine (SEROQUEL) tablet 100 mg (has no administration in time range)  levothyroxine (SYNTHROID) tablet 25 mcg (has no administration in time  range)  albuterol (VENTOLIN HFA) 108 (90 Base) MCG/ACT inhaler 2 puff (has no administration in time range)  sodium chloride 0.9 % 1,000 mL with potassium chloride 10 mEq infusion (has no administration in time range)  ondansetron (ZOFRAN) tablet 4 mg (has no administration in time range)    Or  ondansetron (ZOFRAN) injection 4 mg (has no administration in time range)  acetaminophen (TYLENOL) tablet 650 mg (650 mg Oral Given 09/13/19 1854)  Tdap (BOOSTRIX) injection 0.5 mL (0.5 mLs Intramuscular Given 09/13/19 1855)    Mobility manual wheelchair

## 2019-09-13 NOTE — ED Notes (Signed)
Patient transported to CT 

## 2019-09-13 NOTE — H&P (Signed)
Donna Shepherd is an 63 y.o. female.   Chief Complaint: ich, post traumatic HPI: Donna Shepherd was in her usual state of health today, when while at an adult day care center fell striking her head on the left side. This caused a large amount of swelling and a small abrasion near the left eye. CT showed a very small ich and I was called for further management.  Past Medical History:  Diagnosis Date  . Anxiety   . Asthma   . CKD (chronic kidney disease) stage 3, GFR 30-59 ml/min   . Depressive disorder   . Diabetes mellitus without complication (HCC)   . GERD (gastroesophageal reflux disease)   . Hypertension   . Mixed dyslipidemia   . Osteoporosis   . SUI (stress urinary incontinence, female)   . Vitamin D deficiency     Past Surgical History:  Procedure Laterality Date  . BREAST LUMPECTOMY Right   . MASTECTOMY Left     Family History  Problem Relation Age of Onset  . Diabetes Mother   . Heart disease Mother   . Hyperlipidemia Mother   . Hypertension Mother   . Depression Father   . Diabetes Father   . Heart disease Father   . Hyperlipidemia Father   . Hypertension Father    Social History:  reports that she has never smoked. She has never used smokeless tobacco. She reports that she does not drink alcohol or use drugs.  Allergies:  Allergies  Allergen Reactions  . Codeine   . Morphine And Related Rash  . Sulfa Antibiotics Rash    (Not in a hospital admission)   Results for orders placed or performed during the hospital encounter of 09/13/19 (from the past 48 hour(s))  CBC with Differential     Status: Abnormal   Collection Time: 09/13/19  7:45 PM  Result Value Ref Range   WBC 10.6 (H) 4.0 - 10.5 K/uL   RBC 3.79 (L) 3.87 - 5.11 MIL/uL   Hemoglobin 12.6 12.0 - 15.0 g/dL   HCT 71.6 96.7 - 89.3 %   MCV 101.1 (H) 80.0 - 100.0 fL   MCH 33.2 26.0 - 34.0 pg   MCHC 32.9 30.0 - 36.0 g/dL   RDW 81.0 17.5 - 10.2 %   Platelets 140 (L) 150 - 400 K/uL    Comment: SPECIMEN  CHECKED FOR CLOTS   nRBC 0.0 0.0 - 0.2 %   Neutrophils Relative % 80 %   Neutro Abs 8.5 (H) 1.7 - 7.7 K/uL   Lymphocytes Relative 10 %   Lymphs Abs 1.1 0.7 - 4.0 K/uL   Monocytes Relative 9 %   Monocytes Absolute 0.9 0.1 - 1.0 K/uL   Eosinophils Relative 0 %   Eosinophils Absolute 0.0 0.0 - 0.5 K/uL   Basophils Relative 0 %   Basophils Absolute 0.0 0.0 - 0.1 K/uL   Immature Granulocytes 1 %   Abs Immature Granulocytes 0.05 0.00 - 0.07 K/uL    Comment: Performed at [facility], [address] [place], [place] [address]  Comprehensive metabolic panel     Status: Abnormal   Collection Time: 09/13/19  7:45 PM  Result Value Ref Range   Sodium 136 135 - 145 mmol/L   Potassium 4.3 3.5 - 5.1 mmol/L   Chloride 105 98 - 111 mmol/L   CO2 20 (L) 22 - 32 mmol/L   Glucose, Bld 130 (H) 70 - 99 mg/dL   BUN 13 8 - 23 mg/dL  Creatinine, Ser 1.40 (H) 0.44 - 1.00 mg/dL   Calcium 9.9 8.9 - 16.1 mg/dL   Total Protein 7.2 6.5 - 8.1 g/dL   Albumin 4.4 3.5 - 5.0 g/dL   AST 31 15 - 41 U/L   ALT 40 0 - 44 U/L   Alkaline Phosphatase 71 38 - 126 U/L   Total Bilirubin 0.6 0.3 - 1.2 mg/dL   GFR calc non Af Amer 40 (L) >60 mL/min   GFR calc Af Amer 46 (L) >60 mL/min   Anion gap 11 5 - 15    Comment: Performed at [facility], [address], [place], [place] [address]   Dg Chest 2 View  Result Date: 09/13/2019 CLINICAL DATA:  Fall, confusion EXAM: CHEST - 2 VIEW COMPARISON:  05/16/2004 chest radiograph. FINDINGS: Surgical clips throughout the left breast. Stable cardiomediastinal silhouette with normal heart size. No pneumothorax. No pleural effusion. Minimal platelike scarring versus atelectasis in lower left lung. No pulmonary edema. No acute consolidative airspace disease. Sclerotic lesion in the proximal right femoral shaft is stable since 2005 radiographs, considered benign. No displaced fractures in the visualized chest. IMPRESSION: Minimal platelike scarring  versus atelectasis in the lower left lung. Otherwise no active cardiopulmonary disease. Electronically Signed   By: Delbert Phenix M.D.   On: 09/13/2019 16:58   Dg Pelvis 1-2 Views  Result Date: 09/13/2019 CLINICAL DATA:  Patient tripped and fell. Abrasion and bruising noted over the left forehead. EXAM: PELVIS - 1-2 VIEW COMPARISON:  None. FINDINGS: The bones appear mildly demineralized. There is no evidence of acute pelvic fracture or sacroiliac joint diastasis. The left hip is externally rotated. No definite proximal femur fracture or dislocation. The soft tissues appear unremarkable. IMPRESSION: No acute osseous findings demonstrated within the pelvis. The left hip is externally rotated and suboptimally evaluated. Electronically Signed   By: Carey Bullocks M.D.   On: 09/13/2019 16:58   Ct Head Wo Contrast  Result Date: 09/13/2019 CLINICAL DATA:  Head trauma, headache.  Fall. EXAM: CT HEAD WITHOUT CONTRAST CT CERVICAL SPINE WITHOUT CONTRAST TECHNIQUE: Multidetector CT imaging of the head and cervical spine was performed following the standard protocol without intravenous contrast. Multiplanar CT image reconstructions of the cervical spine were also generated. COMPARISON:  Radiographs of the cervical spine 08/16/2004 FINDINGS: CT HEAD FINDINGS Brain: There is a 7 mm hemorrhagic parenchymal contusion within the anterior left frontal lobe (series 3, image 24) (series 6, image 22). No demarcated cortical infarction. No evidence of intracranial mass. No midline shift or extra-axial fluid collection. Mild scattered ill-defined hypoattenuation within the cerebral white matter is nonspecific, but consistent with chronic small vessel ischemic disease. Cerebral volume is normal for age. Vascular: No hyperdense vessel.  Atherosclerotic calcifications. Skull: Normal. Negative for fracture or focal lesion. Sinuses/Orbits: Visualized orbits demonstrate no acute abnormality. Minimal mucosal thickening within the  inferior right maxillary sinus. Other: Prominent left frontal scalp to left maxillofacial hematoma. CT CERVICAL SPINE FINDINGS Alignment: Straightening of the expected cervical lordosis. Skull base and vertebrae: The basion-dental and atlanto-dental intervals are maintained.No evidence of acute fracture to the cervical spine. Soft tissues and spinal canal: No prevertebral fluid or swelling. No visible canal hematoma. Disc levels: C6-C7 posterior disc osteophyte. No high-grade bony spinal canal stenosis at any level. Upper chest: No consolidation within the imaged lung apices. No visible pneumothorax. These results were called by telephone at the time of interpretation on 09/13/2019 at 7:24 pm to provider [facility] , who verbally acknowledged these results.  IMPRESSION: Head CT: 1. 7 mm acute hemorrhagic parenchymal contusion within the anterior left frontal lobe. 2. Prominent left frontal scalp to left maxillofacial hematoma. 3. Mild chronic small vessel ischemic disease. Cervical Spine CT: No evidence of acute fracture to the cervical spine. Electronically Signed   By: Kellie Simmering DO   On: 09/13/2019 19:25   Ct Cervical Spine Wo Contrast  Result Date: 09/13/2019 CLINICAL DATA:  Head trauma, headache.  Fall. EXAM: CT HEAD WITHOUT CONTRAST CT CERVICAL SPINE WITHOUT CONTRAST TECHNIQUE: Multidetector CT imaging of the head and cervical spine was performed following the standard protocol without intravenous contrast. Multiplanar CT image reconstructions of the cervical spine were also generated. COMPARISON:  Radiographs of the cervical spine 08/16/2004 FINDINGS: CT HEAD FINDINGS Brain: There is a 7 mm hemorrhagic parenchymal contusion within the anterior left frontal lobe (series 3, image 24) (series 6, image 22). No demarcated cortical infarction. No evidence of intracranial mass. No midline shift or extra-axial fluid collection. Mild scattered ill-defined hypoattenuation within the cerebral white matter is  nonspecific, but consistent with chronic small vessel ischemic disease. Cerebral volume is normal for age. Vascular: No hyperdense vessel.  Atherosclerotic calcifications. Skull: Normal. Negative for fracture or focal lesion. Sinuses/Orbits: Visualized orbits demonstrate no acute abnormality. Minimal mucosal thickening within the inferior right maxillary sinus. Other: Prominent left frontal scalp to left maxillofacial hematoma. CT CERVICAL SPINE FINDINGS Alignment: Straightening of the expected cervical lordosis. Skull base and vertebrae: The basion-dental and atlanto-dental intervals are maintained.No evidence of acute fracture to the cervical spine. Soft tissues and spinal canal: No prevertebral fluid or swelling. No visible canal hematoma. Disc levels: C6-C7 posterior disc osteophyte. No high-grade bony spinal canal stenosis at any level. Upper chest: No consolidation within the imaged lung apices. No visible pneumothorax. These results were called by telephone at the time of interpretation on 09/13/2019 at 7:24 pm to provider [facility] , who verbally acknowledged these results. IMPRESSION: Head CT: 1. 7 mm acute hemorrhagic parenchymal contusion within the anterior left frontal lobe. 2. Prominent left frontal scalp to left maxillofacial hematoma. 3. Mild chronic small vessel ischemic disease. Cervical Spine CT: No evidence of acute fracture to the cervical spine. Electronically Signed   By: Kellie Simmering DO   On: 09/13/2019 19:25   Dg Hip Unilat W Or Wo Pelvis 2-3 Views Left  Result Date: 09/13/2019 CLINICAL DATA:  Fall EXAM: DG HIP (WITH OR WITHOUT PELVIS) 2-3V LEFT COMPARISON:  None. FINDINGS: No fracture or dislocation is seen. The left hip joint space is preserved. Visualized bony pelvis appears intact. IMPRESSION: Negative. Electronically Signed   By: Julian Hy M.D.   On: 09/13/2019 19:04    Review of Systems  Unable to perform ROS: Dementia    Blood pressure (!) 142/84, pulse 74,  temperature 98 F (36.7 C), temperature source Oral, resp. rate (!) 24, height 5' 5.5" (1.664 m), weight 69.4 kg, SpO2 100 %. Physical Exam  Constitutional: She appears well-developed and well-nourished. No distress.  HENT:  Right Ear: External ear normal.  Left Ear: External ear normal.  Abrasion left forehead  Eyes: Pupils are equal, round, and reactive to light. Conjunctivae and EOM are normal.  Neck: Normal range of motion. Neck supple.  Cardiovascular: Normal rate and regular rhythm.  Respiratory: Effort normal and breath sounds normal.  Neurological: She is alert. She has normal strength.  Speech is dysarthric, follows some commands Gait not assessed Confused , pleasant Knows her name, and name of her caregiver Moving all  extremities      Assessment/Plan To be observed overnight. No problems at this time. No need for a repeat CT in the morning. Expect her to be discharged in the morning.  Hemorrhage is quite small, posing no risk to the cerebral structures.  Coletta MemosKyle Linsi Humann, MD 09/13/2019, 9:35 PM

## 2019-09-13 NOTE — ED Provider Notes (Signed)
[facility]-EMERGENCY DEPT Provider Note   CSN: [id number] Arrival date & time: 09/13/19  1553    History   Chief Complaint Chief Complaint  Patient presents with   Fall    HPI Donna Shepherd is a 63 y.o. female with past medical history significant for anxiety, asthma, CKD, diabetes, hypertension, affective disorder who presents for evaluation of mechanical fall.  Patient presents with need from assisted living facility.  Patient with witnessed mechanical fall where she tripped and hit her head on concrete.  No anticoagulation.  Patient with a noted abrasion to her left forehead.  She does have some perioral ecchymosis.  Patient at baseline mentation per caregiver.  Walks with a walker most of the time however patient forgetful and will walk without walker.  Patient unable to provide majority of history secondary to "degree of dementia." per caregiver in room. Patient at baseline mentation per caregiver. Lives in caregivers personal home.   Level 5 Caveat- Dementia, per cargiver. No recorded documentation in Epic.    HPI  Past Medical History:  Diagnosis Date   Anxiety    Asthma    CKD (chronic kidney disease) stage 3, GFR 30-59 ml/min    Depressive disorder    Diabetes mellitus without complication (HCC)    GERD (gastroesophageal reflux disease)    Hypertension    Mixed dyslipidemia    Osteoporosis    SUI (stress urinary incontinence, female)    Vitamin D deficiency     There are no active problems to display for this patient.   Past Surgical History:  Procedure Laterality Date   BREAST LUMPECTOMY Right    MASTECTOMY Left      OB History   No obstetric history on file.      Home Medications    Prior to Admission medications   Medication Sig Start Date End Date Taking? Authorizing Provider  albuterol (PROVENTIL HFA;VENTOLIN HFA) 108 (90 Base) MCG/ACT inhaler Inhale into the lungs every 6 (six) hours as needed for wheezing  or shortness of breath.    [provider]  alendronate (FOSAMAX) 35 MG tablet Take 35 mg by mouth every 7 (seven) days. Take with a full glass of water on an empty stomach.    [provider]  amantadine (SYMMETREL) 100 MG capsule Take 100 mg by mouth 2 (two) times daily.    [provider]  aspirin 81 MG tablet Take 81 mg by mouth daily.    [provider]  CALCIUM-VITAMIN D PO Take by mouth.    [provider]  cholecalciferol (VITAMIN D) 1000 units tablet Take 1,000 Units by mouth daily.    [provider]  clonazePAM (KLONOPIN) 0.5 MG tablet Take 0.5 mg by mouth. Take 1 tab in the morning and 2 tabs at night    [provider]  diltiazem (TIAZAC) 120 MG 24 hr capsule Take 120 mg by mouth daily.    [provider]  FLUoxetine (PROZAC) 20 MG capsule Take 40 mg by mouth daily.    [provider]  ibuprofen (ADVIL,MOTRIN) 600 MG tablet Take 600 mg by mouth 3 (three) times daily.    [provider]  levothyroxine (SYNTHROID, LEVOTHROID) 25 MCG tablet Take 25 mcg by mouth daily before breakfast.    [provider]  linaclotide (LINZESS) 290 MCG CAPS capsule Take 290 mcg by mouth daily before breakfast.    [provider]  loxapine (LOXITANE) 10 MG capsule Take 10 mg by mouth  2 (two) times daily.    [provider]  lubiprostone (AMITIZA) 8 MCG capsule Take 8 mcg by mouth 2 (two) times daily with a meal.    [provider]  mirabegron ER (MYRBETRIQ) 25 MG TB24 tablet Take 25 mg by mouth daily.    [provider]  nitrofurantoin, macrocrystal-monohydrate, (MACROBID) 100 MG capsule Take 100 mg by mouth daily.    [provider]  Oxcarbazepine (TRILEPTAL) 300 MG tablet Take 450 mg by mouth 2 (two) times daily.    [provider]  QUEtiapine (SEROQUEL) 100 MG tablet Take 100 mg by mouth. Take 1 tab in the morning, 1 tab in the afternoon, and 3 tab at  bedtime.    [provider]  topiramate (TOPAMAX) 25 MG tablet Take 25 mg by mouth 2 (two) times daily.    [provider]  trimethoprim (TRIMPEX) 100 MG tablet Take 100 mg by mouth daily.    [provider]    Family History Family History  Problem Relation Age of Onset   Diabetes Mother    Heart disease Mother    Hyperlipidemia Mother    Hypertension Mother    Depression Father    Diabetes Father    Heart disease Father    Hyperlipidemia Father    Hypertension Father     Social History Social History   Tobacco Use   Smoking status: Never Smoker   Smokeless tobacco: Never Used  Substance Use Topics   Alcohol use: No   Drug use: No     Allergies   Codeine, Morphine and related, and Sulfa antibiotics   Review of Systems Review of Systems  Unable to perform ROS: Dementia     Physical Exam Updated Vital Signs BP (!) 142/84 (BP Location: Right Arm)    Pulse 74    Temp 98 F (36.7 C) (Oral)    Resp (!) 24    Ht 5' 5.5" (1.664 m)    Wt 69.4 kg    SpO2 100%    BMI 25.07 kg/m   Physical Exam Vitals signs and nursing note reviewed.  Constitutional:      General: She is not in acute distress.    Appearance: She is well-developed. She is not ill-appearing, toxic-appearing or diaphoretic.  HENT:     Head: Normocephalic. Abrasion and left periorbital erythema present. No raccoon eyes, Battle's sign or right periorbital erythema.     Jaw: There is normal jaw occlusion.      Comments: Periorbital ecchymosis to left eye.  Patient with hematoma to left eyebrow.  There is superficial abrasion to area superior to  left eyebrow.  No facial crepitus or step-offs.    Ears:     Comments: No hemotympanum.    Nose:     Comments: No crepitus or septal hematoma    Mouth/Throat:     Comments: Mucous membranes moist.  No oral lesions.  Dentition intact Eyes:     General: Lids are normal.     Extraocular Movements: Extraocular movements  intact.     Conjunctiva/sclera: Conjunctivae normal.     Pupils: Pupils are equal, round, and reactive to light.     Comments: EOMs intact.  Pupils equal reactive to light.  No conjunctival hemorrhage.  Neck:     Musculoskeletal: Full passive range of motion without pain, normal range of motion and neck supple.     Comments: No Midline cervical neck tenderness palpation.  Full range of motion without difficulty. Cardiovascular:  Rate and Rhythm: Normal rate.     Pulses: Normal pulses.     Heart sounds: Normal heart sounds.  Pulmonary:     Effort: Pulmonary effort is normal. No respiratory distress.     Breath sounds: Normal breath sounds and air entry.  Chest:     Comments: NO crepitus or step-offs. No tenderness over bilateral posterior ort anterior ribs. Abdominal:     General: There is no distension.     Comments: Soft, non tender without rebound or guarding   Musculoskeletal: Normal range of motion.     Right hip: Normal.     Left hip: Normal.     Thoracic back: Normal.     Lumbar back: Normal.     Comments: Moves all 4 extremities spontaneously.  Pelvis stable, nontender to palpation.  No shortening or rotation of legs.  Patient able to bear weight and ambulate with a shuffling gait however this is at patient's baseline per caregiver  Skin:    General: Skin is warm and dry.  Neurological:     Mental Status: She is alert.     Motor: Motor function is intact.     Coordination: Coordination abnormal. Finger-Nose-Finger Test abnormal.     Gait: Gait abnormal.     Comments: Ambulatory with nursing in room with shuffle gait, at baseline per caregiver. Bares weight on legs without pain. Very tremulous with finger to nose. Follow minimal commands. Oriented to person however not place. Time- 2020      ED Treatments / Results  Labs (all labs ordered are listed, but only abnormal results are displayed) Labs Reviewed  CBC WITH DIFFERENTIAL/PLATELET - Abnormal; Notable for the  following components:      Result Value   WBC 10.6 (*)    RBC 3.79 (*)    MCV 101.1 (*)    Platelets 140 (*)    Neutro Abs 8.5 (*)    All other components within normal limits  COMPREHENSIVE METABOLIC PANEL - Abnormal; Notable for the following components:   CO2 20 (*)    Glucose, Bld 130 (*)    Creatinine, Ser 1.40 (*)    GFR calc non Af Amer 40 (*)    GFR calc Af Amer 46 (*)    All other components within normal limits  SARS CORONAVIRUS 2 (TAT 6-24 HRS)    EKG None  Radiology Dg Chest 2 View  Result Date: 09/13/2019 CLINICAL DATA:  Fall, confusion EXAM: CHEST - 2 VIEW COMPARISON:  05/16/2004 chest radiograph. FINDINGS: Surgical clips throughout the left breast. Stable cardiomediastinal silhouette with normal heart size. No pneumothorax. No pleural effusion. Minimal platelike scarring versus atelectasis in lower left lung. No pulmonary edema. No acute consolidative airspace disease. Sclerotic lesion in the proximal right femoral shaft is stable since 2005 radiographs, considered benign. No displaced fractures in the visualized chest. IMPRESSION: Minimal platelike scarring versus atelectasis in the lower left lung. Otherwise no active cardiopulmonary disease. Electronically Signed   By: Delbert Phenix M.D.   On: 09/13/2019 16:58   Dg Pelvis 1-2 Views  Result Date: 09/13/2019 CLINICAL DATA:  Patient tripped and fell. Abrasion and bruising noted over the left forehead. EXAM: PELVIS - 1-2 VIEW COMPARISON:  None. FINDINGS: The bones appear mildly demineralized. There is no evidence of acute pelvic fracture or sacroiliac joint diastasis. The left hip is externally rotated. No definite proximal femur fracture or dislocation. The soft tissues appear unremarkable. IMPRESSION: No acute osseous findings demonstrated within the pelvis. The left hip  is externally rotated and suboptimally evaluated. Electronically Signed   By: Carey BullocksWilliam  Veazey M.D.   On: 09/13/2019 16:58   Ct Head Wo  Contrast  Result Date: 09/13/2019 CLINICAL DATA:  Head trauma, headache.  Fall. EXAM: CT HEAD WITHOUT CONTRAST CT CERVICAL SPINE WITHOUT CONTRAST TECHNIQUE: Multidetector CT imaging of the head and cervical spine was performed following the standard protocol without intravenous contrast. Multiplanar CT image reconstructions of the cervical spine were also generated. COMPARISON:  Radiographs of the cervical spine 08/16/2004 FINDINGS: CT HEAD FINDINGS Brain: There is a 7 mm hemorrhagic parenchymal contusion within the anterior left frontal lobe (series 3, image 24) (series 6, image 22). No demarcated cortical infarction. No evidence of intracranial mass. No midline shift or extra-axial fluid collection. Mild scattered ill-defined hypoattenuation within the cerebral white matter is nonspecific, but consistent with chronic small vessel ischemic disease. Cerebral volume is normal for age. Vascular: No hyperdense vessel.  Atherosclerotic calcifications. Skull: Normal. Negative for fracture or focal lesion. Sinuses/Orbits: Visualized orbits demonstrate no acute abnormality. Minimal mucosal thickening within the inferior right maxillary sinus. Other: Prominent left frontal scalp to left maxillofacial hematoma. CT CERVICAL SPINE FINDINGS Alignment: Straightening of the expected cervical lordosis. Skull base and vertebrae: The basion-dental and atlanto-dental intervals are maintained.No evidence of acute fracture to the cervical spine. Soft tissues and spinal canal: No prevertebral fluid or swelling. No visible canal hematoma. Disc levels: C6-C7 posterior disc osteophyte. No high-grade bony spinal canal stenosis at any level. Upper chest: No consolidation within the imaged lung apices. No visible pneumothorax. These results were called by telephone at the time of interpretation on 09/13/2019 at 7:24 pm to provider [facility] Ezella Kell , who verbally acknowledged these results. IMPRESSION: Head CT: 1. 7 mm acute hemorrhagic  parenchymal contusion within the anterior left frontal lobe. 2. Prominent left frontal scalp to left maxillofacial hematoma. 3. Mild chronic small vessel ischemic disease. Cervical Spine CT: No evidence of acute fracture to the cervical spine. Electronically Signed   By: Jackey LogeKyle  Golden DO   On: 09/13/2019 19:25   Ct Cervical Spine Wo Contrast  Result Date: 09/13/2019 CLINICAL DATA:  Head trauma, headache.  Fall. EXAM: CT HEAD WITHOUT CONTRAST CT CERVICAL SPINE WITHOUT CONTRAST TECHNIQUE: Multidetector CT imaging of the head and cervical spine was performed following the standard protocol without intravenous contrast. Multiplanar CT image reconstructions of the cervical spine were also generated. COMPARISON:  Radiographs of the cervical spine 08/16/2004 FINDINGS: CT HEAD FINDINGS Brain: There is a 7 mm hemorrhagic parenchymal contusion within the anterior left frontal lobe (series 3, image 24) (series 6, image 22). No demarcated cortical infarction. No evidence of intracranial mass. No midline shift or extra-axial fluid collection. Mild scattered ill-defined hypoattenuation within the cerebral white matter is nonspecific, but consistent with chronic small vessel ischemic disease. Cerebral volume is normal for age. Vascular: No hyperdense vessel.  Atherosclerotic calcifications. Skull: Normal. Negative for fracture or focal lesion. Sinuses/Orbits: Visualized orbits demonstrate no acute abnormality. Minimal mucosal thickening within the inferior right maxillary sinus. Other: Prominent left frontal scalp to left maxillofacial hematoma. CT CERVICAL SPINE FINDINGS Alignment: Straightening of the expected cervical lordosis. Skull base and vertebrae: The basion-dental and atlanto-dental intervals are maintained.No evidence of acute fracture to the cervical spine. Soft tissues and spinal canal: No prevertebral fluid or swelling. No visible canal hematoma. Disc levels: C6-C7 posterior disc osteophyte. No high-grade bony  spinal canal stenosis at any level. Upper chest: No consolidation within the imaged lung apices. No visible pneumothorax. These results  were called by telephone at the time of interpretation on 09/13/2019 at 7:24 pm to provider [facility] , who verbally acknowledged these results. IMPRESSION: Head CT: 1. 7 mm acute hemorrhagic parenchymal contusion within the anterior left frontal lobe. 2. Prominent left frontal scalp to left maxillofacial hematoma. 3. Mild chronic small vessel ischemic disease. Cervical Spine CT: No evidence of acute fracture to the cervical spine. Electronically Signed   By: Jackey Loge DO   On: 09/13/2019 19:25   Dg Hip Unilat W Or Wo Pelvis 2-3 Views Left  Result Date: 09/13/2019 CLINICAL DATA:  Fall EXAM: DG HIP (WITH OR WITHOUT PELVIS) 2-3V LEFT COMPARISON:  None. FINDINGS: No fracture or dislocation is seen. The left hip joint space is preserved. Visualized bony pelvis appears intact. IMPRESSION: Negative. Electronically Signed   By: Charline Bills M.D.   On: 09/13/2019 19:04    Procedures .Critical Care Performed by: Linwood Dibbles, PA-C Authorized by: Linwood Dibbles, PA-C   Critical care provider statement:    Critical care time (minutes):  35   Critical care was necessary to treat or prevent imminent or life-threatening deterioration of the following conditions:  CNS failure or compromise (Acute brain contusion/ hemorrhage)   Critical care was time spent personally by me on the following activities:  Discussions with consultants, evaluation of patient's response to treatment, examination of patient, ordering and performing treatments and interventions, ordering and review of laboratory studies, ordering and review of radiographic studies, pulse oximetry, re-evaluation of patient's condition, obtaining history from patient or surrogate and review of old charts   (including critical care time)  Medications Ordered in ED Medications  acetaminophen  (TYLENOL) tablet 650 mg (650 mg Oral Given 09/13/19 1854)  Tdap (BOOSTRIX) injection 0.5 mL (0.5 mLs Intramuscular Given 09/13/19 1855)   Initial Impression / Assessment and Plan / ED Course  I have reviewed the triage vital signs and the nursing notes.  Pertinent labs & imaging results that were available during my care of the patient were reviewed by me and considered in my medical decision making (see chart for details).  8 old female presents for evaluation after mechanical fall which was witnessed at day care facility. No LOC per facility.  She is afebrile, nonseptic, non-ill-appearing.  She was ambulatory since the incident however per caregiver patient with worsening gait over the last months.  She does have ecchymosis and contusion to left superior periorbital region.  EOMs intact.  No evidence of ocular entrapment.  No tenderness of bilateral ribs.  Pelvis stable, nontender palpation.  No shortening or rotation of legs.  Patient unable to provide history given her prior medical history. Caregiver states dementia and heavy psychiatric medications however Epic does not show record of dementia. So unknown dx at this time?  Plain film pelvis does show possibly externally rotated left leg which was suboptimally viewed.  Obtain plain film left hip to further evaluate for fracture however low suspicion for this given patient is able to bear weight.  Also obtain CT head and cervical region.  Her laceration does not require suturing however we will clean this wound and update her tetanus. She is at her baseline mentation and gait per caregiver in room with patient. Question mentation from possible dementia vs multiple psychiatric meds. She is unable to perform finger to nose secondary to tremors. Low suspicion for NMS at this time as cause of her tremors given long hx of this per caregiver. Shuffling gait however at baseline.  CT  head with 7mm hemorrhagic parenchymal contusion within the left front lobe.  CT cervical negative. Will consult with neurosurgery for treatment recommendations. Caregiver does not feel comfortable taking patient home with concerns for neurologic decompensation. Plain films negative.  CONSULT with Dr. Franky Macho with Neurosurgery who recommends Obs overnight given caregiver uncomfortable with taking patient home. He will admit the patient. Labs orders and COVID testing placed. VS stable at this time.  Labs at baseline. COVID pending however no concern for COVID infection.  The patient appears reasonably stabilized for admission considering the current resources, flow, and capabilities available in the ED at this time, and I doubt any other [facility] requiring further screening and/or treatment in the ED prior to admission.       Final Clinical Impressions(s) / ED Diagnoses   Final diagnoses:  Fall, initial encounter  Contusion of brain without loss of consciousness, initial encounter [facility])    ED Discharge Orders    None       Kiira Brach A, PA-C 09/13/19 2025    Milagros Loll, MD 09/15/19 [phone]

## 2019-09-13 NOTE — ED Notes (Signed)
Patient repositioned in chair in treatment room.

## 2019-09-14 ENCOUNTER — Observation Stay (HOSPITAL_COMMUNITY): Payer: Medicare Other

## 2019-09-14 DIAGNOSIS — S06300A Unspecified focal traumatic brain injury without loss of consciousness, initial encounter: Secondary | ICD-10-CM | POA: Diagnosis not present

## 2019-09-14 LAB — SARS CORONAVIRUS 2 (TAT 6-24 HRS): SARS Coronavirus 2: NEGATIVE

## 2019-09-14 LAB — HIV ANTIBODY (ROUTINE TESTING W REFLEX): HIV Screen 4th Generation wRfx: NONREACTIVE

## 2019-09-14 NOTE — Discharge Summary (Signed)
Physician Discharge Summary  Patient ID: Donna Shepherd MRN: [id number] DOB/AGE: 1955/12/24 63 y.o.  Admit date: 09/13/2019 Discharge date: 09/14/2019  Admission Diagnoses: Closed head injury with left frontal contusion, depressive disorder with anxiety  Discharge Diagnoses: Closed head injury with left frontal contusion, depressive disorder with anxiety Active Problems:   Traumatic intracerebral hemorrhage [facility])   Discharged Condition: good  Hospital Course: Patient was admitted for observation after was noted that she had a small left frontal contusion.  She had a significant subgaleal hematoma from a fall that she sustained earlier today.  Because of the late hour she was admitted for observation.  A CT was repeated the next morning which is stable.  She is discharged to her assisted living facility.  Consults: None  Significant Diagnostic Studies: None  Treatments: Observation  Discharge Exam: Blood pressure 127/77, pulse 70, temperature 97.7 F (36.5 C), temperature source Oral, resp. rate (!) 21, height 5' 5.5" (1.664 m), weight 69.4 kg, SpO2 97 %. Awake and alert with baseline speech difficulties and dysarthria that is stable from time of admission.  Patient is extremely anxious but will follow commands and is oriented x2.  Disposition: Discharge disposition: 70-Another Health Care Institution Not Defined       Discharge Instructions    Diet - low sodium heart healthy   Complete by: As directed    Increase activity slowly   Complete by: As directed      Allergies as of 09/14/2019      Reactions   Codeine    Morphine And Related Rash   Sulfa Antibiotics Rash      Medication List    TAKE these medications   acetaminophen 500 MG tablet Commonly known as: TYLENOL Take 500 mg by mouth every 8 (eight) hours as needed for mild pain.   albuterol 108 (90 Base) MCG/ACT inhaler Commonly known as: VENTOLIN HFA Inhale 2 puffs into the lungs every 6 (six) hours as  needed for wheezing or shortness of breath.   alendronate 35 MG tablet Commonly known as: FOSAMAX Take 35 mg by mouth every 7 (seven) days. Take with a full glass of water on an empty stomach.   amantadine 100 MG capsule Commonly known as: SYMMETREL Take 100 mg by mouth 2 (two) times daily.   aspirin 81 MG tablet Take 81 mg by mouth daily.   atorvastatin 20 MG tablet Commonly known as: LIPITOR Take 20 mg by mouth at bedtime.   BENEFIBER DRINK MIX PO Take 4 g by mouth daily.   bismuth subsalicylate 262 MG/15ML suspension Commonly known as: PEPTO BISMOL Take 30 mLs by mouth 4 (four) times daily as needed.   CALCIUM-VITAMIN D PO Take 1 tablet by mouth 2 (two) times daily.   cholecalciferol 1000 units tablet Commonly known as: VITAMIN D Take 1,000 Units by mouth daily.   clonazePAM 0.5 MG tablet Commonly known as: KLONOPIN Take 0.5 mg by mouth. Take 1 tab in the morning and 2 tabs at night   diltiazem 120 MG 24 hr capsule Commonly known as: TIAZAC Take 120 mg by mouth daily.   diphenhydrAMINE 25 MG tablet Commonly known as: SOMINEX Take 25-50 mg by mouth 4 (four) times daily as needed for sleep.   docusate sodium 100 MG capsule Commonly known as: COLACE Take 100 mg by mouth 2 (two) times daily.   FLUoxetine 20 MG capsule Commonly known as: PROZAC Take 40 mg by mouth daily.   ibuprofen 600 MG tablet Commonly known as: ADVIL  Take 600 mg by mouth every 8 (eight) hours as needed.   levothyroxine 25 MCG tablet Commonly known as: SYNTHROID Take 25 mcg by mouth daily before breakfast.   Linzess 290 MCG Caps capsule Generic drug: linaclotide Take 290 mcg by mouth daily before breakfast.   loxapine 10 MG capsule Commonly known as: LOXITANE Take 10 mg by mouth 2 (two) times daily.   lubiprostone 8 MCG capsule Commonly known as: AMITIZA Take 8 mcg by mouth 2 (two) times daily with a meal.   magnesium hydroxide 400 MG/5ML suspension Commonly known as: MILK OF  MAGNESIA Take 5 mLs by mouth 4 (four) times daily as needed for mild constipation.   meloxicam 15 MG tablet Commonly known as: MOBIC Take 15 mg by mouth daily as needed.   Myrbetriq 25 MG Tb24 tablet Generic drug: mirabegron ER Take 25 mg by mouth daily.   nitrofurantoin (macrocrystal-monohydrate) 100 MG capsule Commonly known as: MACROBID Take 100 mg by mouth daily.   Oxcarbazepine 300 MG tablet Commonly known as: TRILEPTAL Take 300 mg by mouth 2 (two) times daily.   polyethylene glycol 17 g packet Commonly known as: MIRALAX / GLYCOLAX Take 17 g by mouth 2 (two) times daily as needed.   QUEtiapine 300 MG tablet Commonly known as: SEROQUEL Take 600 mg by mouth at bedtime. Take 2 tablets at bedtime at 2000.   QUEtiapine 100 MG tablet Commonly known as: SEROQUEL Take 100 mg by mouth 3 (three) times daily. Take 1 tab at 0800, 1 tablet at 1200, and 1 tablet at 1500.   senna-docusate 8.6-50 MG tablet Commonly known as: Senokot-S Take 2 tablets by mouth 2 (two) times daily as needed for mild constipation.   topiramate 25 MG tablet Commonly known as: TOPAMAX Take 25 mg by mouth 2 (two) times daily.   traMADol 50 MG tablet Commonly known as: ULTRAM Take 50 mg by mouth every 6 (six) hours as needed.   trimethoprim 100 MG tablet Commonly known as: TRIMPEX Take 100 mg by mouth daily.   Trulance 3 MG Tabs Generic drug: Plecanatide Take 1 tablet by mouth daily.        Signed: Blanchie Dessert Annelisa Ryback 09/14/2019, 10:03 AM

## 2019-09-14 NOTE — ED Notes (Addendum)
[facility] at bedside. Caregiver updated with permission from pt.

## 2019-09-14 NOTE — Progress Notes (Signed)
Patient ID: Donna Shepherd, female   DOB: Sep 21, 1956, 63 y.o.   MRN: [id number] Concerned over patient's level of consciousness this morning and speech difficulties was close enough for concern to repeat the CT scan not knowing the patient's baseline function.  CT scan shows that there is been no progression and perhaps some resolution of the small frontal contusion in the left side.  I believe that she is stable for discharge we will give her a diet and I will write discharge orders today.

## 2019-09-14 NOTE — Progress Notes (Signed)
D/C education given to care giver Patrecia Pour and all questions answered. No printed prescriptions to give or equipment to deliver. IV's removed. Pt taken to car with all belongings.

## 2019-10-20 ENCOUNTER — Emergency Department (HOSPITAL_COMMUNITY): Payer: Medicare Other

## 2019-10-20 ENCOUNTER — Other Ambulatory Visit: Payer: Self-pay

## 2019-10-20 ENCOUNTER — Emergency Department (HOSPITAL_COMMUNITY)
Admission: EM | Admit: 2019-10-20 | Discharge: 2019-10-21 | Disposition: A | Discharge status: Home / Self Care | Payer: Medicare Other | Attending: Emergency Medicine | Admitting: Emergency Medicine

## 2019-10-20 DIAGNOSIS — E1122 Type 2 diabetes mellitus with diabetic chronic kidney disease: Secondary | ICD-10-CM | POA: Diagnosis not present

## 2019-10-20 DIAGNOSIS — F0390 Unspecified dementia without behavioral disturbance: Secondary | ICD-10-CM | POA: Diagnosis not present

## 2019-10-20 DIAGNOSIS — I129 Hypertensive chronic kidney disease with stage 1 through stage 4 chronic kidney disease, or unspecified chronic kidney disease: Secondary | ICD-10-CM | POA: Insufficient documentation

## 2019-10-20 DIAGNOSIS — Z7982 Long term (current) use of aspirin: Secondary | ICD-10-CM | POA: Diagnosis not present

## 2019-10-20 DIAGNOSIS — J45909 Unspecified asthma, uncomplicated: Secondary | ICD-10-CM | POA: Insufficient documentation

## 2019-10-20 DIAGNOSIS — R339 Retention of urine, unspecified: Secondary | ICD-10-CM | POA: Diagnosis not present

## 2019-10-20 DIAGNOSIS — N183 Chronic kidney disease, stage 3 unspecified: Secondary | ICD-10-CM | POA: Diagnosis not present

## 2019-10-20 DIAGNOSIS — R251 Tremor, unspecified: Secondary | ICD-10-CM | POA: Diagnosis present

## 2019-10-20 DIAGNOSIS — Z79899 Other long term (current) drug therapy: Secondary | ICD-10-CM | POA: Diagnosis not present

## 2019-10-20 LAB — CBC WITH DIFFERENTIAL/PLATELET
Abs Immature Granulocytes: 0.03 10*3/uL (ref 0.00–0.07)
Basophils Absolute: 0 10*3/uL (ref 0.0–0.1)
Basophils Relative: 0 %
Eosinophils Absolute: 0 10*3/uL (ref 0.0–0.5)
Eosinophils Relative: 0 %
HCT: 35.5 % — ABNORMAL LOW (ref 36.0–46.0)
Hemoglobin: 12.1 g/dL (ref 12.0–15.0)
Immature Granulocytes: 1 %
Lymphocytes Relative: 8 %
Lymphs Abs: 0.5 10*3/uL — ABNORMAL LOW (ref 0.7–4.0)
MCH: 34.7 pg — ABNORMAL HIGH (ref 26.0–34.0)
MCHC: 34.1 g/dL (ref 30.0–36.0)
MCV: 101.7 fL — ABNORMAL HIGH (ref 80.0–100.0)
Monocytes Absolute: 0.4 10*3/uL (ref 0.1–1.0)
Monocytes Relative: 7 %
Neutro Abs: 5.4 10*3/uL (ref 1.7–7.7)
Neutrophils Relative %: 84 %
Platelets: 142 10*3/uL — ABNORMAL LOW (ref 150–400)
RBC: 3.49 MIL/uL — ABNORMAL LOW (ref 3.87–5.11)
RDW: 15 % (ref 11.5–15.5)
WBC: 6.4 10*3/uL (ref 4.0–10.5)
nRBC: 0 % (ref 0.0–0.2)

## 2019-10-20 LAB — COMPREHENSIVE METABOLIC PANEL
ALT: 50 U/L — ABNORMAL HIGH (ref 0–44)
AST: 35 U/L (ref 15–41)
Albumin: 4.1 g/dL (ref 3.5–5.0)
Alkaline Phosphatase: 79 U/L (ref 38–126)
Anion gap: 11 (ref 5–15)
BUN: 9 mg/dL (ref 8–23)
CO2: 21 mmol/L — ABNORMAL LOW (ref 22–32)
Calcium: 10.2 mg/dL (ref 8.9–10.3)
Chloride: 106 mmol/L (ref 98–111)
Creatinine, Ser: 1.15 mg/dL — ABNORMAL HIGH (ref 0.44–1.00)
GFR calc Af Amer: 58 mL/min — ABNORMAL LOW (ref >60)
GFR calc non Af Amer: 50 mL/min — ABNORMAL LOW (ref >60)
Glucose, Bld: 166 mg/dL — ABNORMAL HIGH (ref 70–99)
Potassium: 3.7 mmol/L (ref 3.5–5.1)
Sodium: 138 mmol/L (ref 135–145)
Total Bilirubin: 0.8 mg/dL (ref 0.3–1.2)
Total Protein: 6.6 g/dL (ref 6.5–8.1)

## 2019-10-20 LAB — URINALYSIS, ROUTINE W REFLEX MICROSCOPIC
Bilirubin Urine: NEGATIVE
Bilirubin Urine: NEGATIVE
Glucose, UA: NEGATIVE mg/dL
Glucose, UA: NEGATIVE mg/dL
Hgb urine dipstick: NEGATIVE
Hgb urine dipstick: NEGATIVE
Ketones, ur: NEGATIVE mg/dL
Ketones, ur: NEGATIVE mg/dL
Leukocytes,Ua: NEGATIVE
Nitrite: NEGATIVE
Nitrite: NEGATIVE
Protein, ur: 30 mg/dL — AB
Protein, ur: NEGATIVE mg/dL
Specific Gravity, Urine: 1.011 (ref 1.005–1.030)
Specific Gravity, Urine: 1.01 (ref 1.005–1.030)
pH: 8 (ref 5.0–8.0)
pH: 8 (ref 5.0–8.0)

## 2019-10-20 LAB — CBG MONITORING, ED: Glucose-Capillary: 157 mg/dL — ABNORMAL HIGH (ref 70–99)

## 2019-10-20 NOTE — ED Provider Notes (Signed)
I assumed care of patient at shift change, please see their note for full H&P.  Briefly patient is here for evaluation of tremors.  Patient is reportedly at her current baseline over the past week.  She has reportedly had decrease in functional status since she fell 1 month ago with a parenchymal hemorrhage.   Physical Exam  BP 118/73   Pulse 62   Temp 98.3 F (36.8 C) (Rectal)   Resp 15   Ht 5\' 4"  (1.626 m)   Wt 74.8 kg   SpO2 99%   BMI 28.32 kg/m   Physical Exam Vitals and nursing note reviewed.  Constitutional:      General: She is not in acute distress.    Appearance: She is well-developed. She is not diaphoretic.  HENT:     Head: Normocephalic and atraumatic.  Eyes:     General: No scleral icterus.       Right eye: No discharge.        Left eye: No discharge.     Conjunctiva/sclera: Conjunctivae normal.  Cardiovascular:     Rate and Rhythm: Normal rate and regular rhythm.  Pulmonary:     Effort: Pulmonary effort is normal. No respiratory distress.     Breath sounds: No stridor.  Abdominal:     General: There is no distension.  Musculoskeletal:        General: No deformity.     Cervical back: Normal range of motion.     Right lower leg: No edema.     Left lower leg: No edema.  Skin:    General: Skin is warm and dry.  Neurological:     Mental Status: She is alert.     Motor: No abnormal muscle tone.     Comments: Patient is reportedly at her current normal baseline.  She is oriented to person.  She requests ice chips.  She is able to hold bilateral legs off the bed for 5 seconds strength with ankle dorsiflexion and plantarflexion is intact.  Has a mild tremor.    Psychiatric:        Mood and Affect: Mood normal.        Behavior: Behavior normal.     ED Course/Procedures   Clinical Course as of Oct 19 2052  Fri Oct 20, 2019  1503 IMPRESSION: 1. Interval resolution of previously noted tiny intraparenchymal hemorrhage within the anteromedial aspect of the  left frontal lobe. 2. Otherwise, similar findings of centralized atrophy without superimposed acute intracranial process. 3. Nearly completely resolved left frontal scalp hematoma.    CT Head Wo Contrast [CG]    Clinical Course User Index [CG] Kinnie Feil, PA-C    Procedures  Labs Reviewed  CBC WITH DIFFERENTIAL/PLATELET - Abnormal; Notable for the following components:      Result Value   RBC 3.49 (*)    HCT 35.5 (*)    MCV 101.7 (*)    MCH 34.7 (*)    Platelets 142 (*)    Lymphs Abs 0.5 (*)    All other components within normal limits  COMPREHENSIVE METABOLIC PANEL - Abnormal; Notable for the following components:   CO2 21 (*)    Glucose, Bld 166 (*)    Creatinine, Ser 1.15 (*)    ALT 50 (*)    GFR calc non Af Amer 50 (*)    GFR calc Af Amer 58 (*)    All other components within normal limits  URINALYSIS, ROUTINE W REFLEX MICROSCOPIC - Abnormal;  Notable for the following components:   Color, Urine AMBER (*)    APPearance TURBID (*)    Protein, ur 30 (*)    Leukocytes,Ua LARGE (*)    Bacteria, UA FEW (*)    All other components within normal limits  URINALYSIS, ROUTINE W REFLEX MICROSCOPIC - Abnormal; Notable for the following components:   APPearance HAZY (*)    All other components within normal limits  CBG MONITORING, ED - Abnormal; Notable for the following components:   Glucose-Capillary 157 (*)    All other components within normal limits  URINE CULTURE     MDM   Plan is to follow-up on UA.  Patient is reportedly at her current baseline after her fall a month ago.  Initial UA was obtained not as a cath urine and appeared significantly contaminated.  I asked the nurse to obtain a cath urine which was sent down without evidence of significant infection.  Of note the In-N-Out cath drained a liter from patient's bladder and she had recently urinated, raising concern for urinary retention.  I spoke with patient's healthcare power of attorney who agreed  for Foley catheter placement.  She already has outpatient follow-up arranged with her primary care doctor and her psychiatrist.  According to power of attorney she is significant problems with constipation and suspect that that may be contributing.  We discussed additional evaluation in the emergency room versus evaluation with PCP as an outpatient.  Orders for Foley catheter placed and POA agreed with additional outpatient work-up.  Additionally it may be that with her psychiatric medication dose changes that is causing urinary retention.  She has a follow-up appointment on Tuesday with psychiatry.  This patient was seen as a shared visit with Dr. Stevie Kern.  Patient will be discharged with foley for outpatient follow up.   Note: Portions of this report may have been transcribed using voice recognition software. Every effort was made to ensure accuracy; however, inadvertent computerized transcription errors may be present     Norman Clay 10/21/19 9678    Milagros Loll, MD 10/23/19 1257

## 2019-10-20 NOTE — ED Provider Notes (Signed)
[facility] EMERGENCY DEPARTMENT Provider Note   CSN: [id number] Arrival date & time: 10/20/19  1003     History Chief Complaint  Patient presents with  . Tremors    Donna Shepherd is a 64 y.o. female with history of anxiety, schizoaffective disorder, diabetes, hypertension brought to the ER by EMS from mental health group home for evaluation of generalized tremors.  History obtained from triage note and patient initially.  Per triage note patient complained of shortness of breath and was noted to have increased tremors all over her body this morning which prompted ER visit.  Patient is oriented to her name only.  States she is hungry and also has to urinate.  Follows simple, minimal commands unreliably.  Intermittent moaning and screaming out during exam but otherwise redirectable and cooperative.  No one at bedside.  Will attempt to obtain corroborating information.  Level 5 caveat due to reported history of dementia, psychiatric illness.  I spoke to [facility] caregiver at living facility.  Reports patient has not "been the same" since her fall 1 month ago.  Since patient has had increased generalized tremors, interrupted speech, difficulty with walking needing a 1 person assist or walking device.  Has had poor appetite as well.  Sometimes tremors of hands makes it difficult for patient to feed herself now needing help with that as well.  States prior to the fall 1 month ago patient was fully oriented and had a full conversation with occasional confusion, and essentially took care of herself with minimal assistance but ever since a fall this has changed.  Caregiver contacted psychiatrist who recommended changes in Seroquel dosing instead of 3 times daily to once nightly.  This medicine was changed yesterday.  No other recent medication changes.  Patient had a fall 1 week ago that was witnessed.  Per caregiver, patient slid against the wall and sat on the ground but there was no  head trauma.  Caregiver denies any recent fever, coughing, vomiting.  Patient has appointment with PCP on Monday in the psychiatry appointment on Tuesday. Caregiver states things have been worsening since fall but this morning generalized body tremors were worse, she was "talking out of her mind" and she looked like she was breathing hard which prompted ER visit.   Spoke to patient's brother who corroborates this and states patient has had intermittent body jerking for the last 30 years, states psychiatrist prescribes Seroquel for this but recently decreased the dose.  He also denies any acute changes in patient's mental status in the last couple of days but does agree since her fall she has been declining progressively.  HPI     Past Medical History:  Diagnosis Date  . Anxiety   . Asthma   . CKD (chronic kidney disease) stage 3, GFR 30-59 ml/min   . Depressive disorder   . Diabetes mellitus without complication ([place])   . GERD (gastroesophageal reflux disease)   . Hypertension   . Mixed dyslipidemia   . Osteoporosis   . SUI (stress urinary incontinence, female)   . Vitamin D deficiency     Patient Active Problem List   Diagnosis Date Noted  . 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 BREAST LUMPECTOMY Right   . MASTECTOMY Left      OB History   No obstetric history on file.     Family History  Problem Relation Age of Onset  .  Diabetes Mother   . Heart disease Mother   . Hyperlipidemia Mother   . Hypertension Mother   . Depression Father   . Diabetes Father   . Heart disease Father   . Hyperlipidemia Father   . Hypertension Father     Social History   Tobacco Use  . Smoking status: Never Smoker  . Smokeless tobacco: Never Used  Substance Use Topics  . Alcohol use: No  . Drug use: No    Home Medications Prior to Admission medications   Medication Sig Start Date End Date Taking? Authorizing  Provider  acetaminophen (TYLENOL) 500 MG tablet Take 500 mg by mouth every 8 (eight) hours as needed for mild pain.    [provider]  albuterol (PROVENTIL HFA;VENTOLIN HFA) 108 (90 Base) MCG/ACT inhaler Inhale 2 puffs into the lungs every 6 (six) hours as needed for wheezing or shortness of breath.     [provider]  alendronate (FOSAMAX) 35 MG tablet Take 35 mg by mouth every 7 (seven) days. Take with a full glass of water on an empty stomach.    [provider]  amantadine (SYMMETREL) 100 MG capsule Take 100 mg by mouth 2 (two) times daily.    [provider]  aspirin 81 MG tablet Take 81 mg by mouth daily.    [provider]  atorvastatin (LIPITOR) 20 MG tablet Take 20 mg by mouth at bedtime. 09/12/19   [provider]  bismuth subsalicylate (PEPTO BISMOL) 262 MG/15ML suspension Take 30 mLs by mouth 4 (four) times daily as needed.    [provider]  CALCIUM-VITAMIN D PO Take 1 tablet by mouth 2 (two) times daily.     [provider]  cholecalciferol (VITAMIN D) 1000 units tablet Take 1,000 Units by mouth daily.    [provider]  clonazePAM (KLONOPIN) 0.5 MG tablet Take 0.5 mg by mouth. Take 1 tab in the morning and 2 tabs at night    [provider]  diltiazem (TIAZAC) 120 MG 24 hr capsule Take 120 mg by mouth daily.    [provider]  diphenhydrAMINE (SOMINEX) 25 MG tablet Take 25-50 mg by mouth 4 (four) times daily as needed for sleep.    [provider]  docusate sodium (COLACE) 100 MG capsule Take 100 mg by mouth 2 (two) times daily.    [provider]  FLUoxetine (PROZAC) 20 MG capsule Take 40 mg by mouth daily.    [provider]  ibuprofen (ADVIL,MOTRIN) 600 MG tablet Take 600 mg by mouth every 8 (eight) hours as needed.     [provider]  levothyroxine (SYNTHROID, LEVOTHROID) 25 MCG tablet Take 25 mcg by mouth daily before breakfast.    [provider]  linaclotide (LINZESS) 290 MCG CAPS capsule Take 290 mcg by mouth daily before breakfast.    [provider]  loxapine (LOXITANE) 10 MG capsule Take 10 mg by mouth 2 (two) times daily.    [provider]  lubiprostone (AMITIZA) 8 MCG capsule Take 8 mcg by mouth 2 (two) times daily with a meal.    [provider]  magnesium hydroxide (MILK OF MAGNESIA) 400 MG/5ML suspension Take 5 mLs by mouth 4 (four) times daily as needed for mild constipation.    [provider]  meloxicam (MOBIC) 15 MG tablet Take 15 mg by mouth daily as needed.     [provider]  mirabegron ER (MYRBETRIQ) 25 MG TB24 tablet Take 25  mg by mouth daily.    [provider]  nitrofurantoin, macrocrystal-monohydrate, (MACROBID) 100 MG capsule Take 100 mg by mouth daily.    [provider]  Oxcarbazepine (TRILEPTAL) 300 MG tablet Take 300 mg by mouth 2 (two) times daily.     [provider]  Plecanatide (TRULANCE) 3 MG TABS Take 1 tablet by mouth daily.    [provider]  polyethylene glycol (MIRALAX / GLYCOLAX) 17 g packet Take 17 g by mouth 2 (two) times daily as needed.    [provider]  QUEtiapine (SEROQUEL) 100 MG tablet Take 100 mg by mouth 3 (three) times daily. Take 1 tab at 0800, 1 tablet at 1200, and 1 tablet at 1500.    [provider]  QUEtiapine (SEROQUEL) 300 MG tablet Take 600 mg by mouth at bedtime. Take 2 tablets at bedtime at 2000.    [provider]  senna-docusate (SENOKOT-S) 8.6-50 MG tablet Take 2 tablets by mouth 2 (two) times daily as needed for mild constipation.    [provider]  topiramate (TOPAMAX) 25 MG tablet Take 25 mg by mouth 2 (two) times daily.    [provider]  traMADol (ULTRAM) 50 MG tablet Take 50 mg by mouth every 6 (six) hours as needed.    [provider]  trimethoprim (TRIMPEX) 100 MG tablet Take 100 mg by mouth daily.    [provider]  Wheat Dextrin (BENEFIBER DRINK MIX PO) Take 4 g by mouth daily.    [provider]    Allergies    Codeine, Morphine and related, and Sulfa antibiotics  Review of Systems   Review of Systems  Unable to perform ROS: Dementia    Physical Exam Updated Vital Signs BP 129/76   Pulse 72   Temp 98.3 F (36.8 C) (Rectal)   Resp 15   Ht 5\' 4"  (1.626 m)   Wt 74.8 kg   SpO2 97%   BMI 28.32 kg/m   Physical Exam Constitutional:      Appearance: She is well-developed.     Comments: Disheveled.  Awake.  HENT:     Head: Normocephalic.     Nose: Nose normal.  Eyes:     General: Lids are normal.  Cardiovascular:     Rate and Rhythm: Normal rate and regular rhythm.  Pulmonary:     Effort: Pulmonary effort is normal.     Breath sounds: Normal breath sounds.  Abdominal:     Palpations: Abdomen is soft.     Tenderness: There is no abdominal tenderness.  Musculoskeletal:        General: Normal range of motion.     Cervical back: Normal range of motion.  Neurological:     Mental Status: She is alert. She is disoriented.     Motor: Weakness present.     Coordination: Coordination abnormal.     Comments: Some parts of neurological exam limited due to psychiatric illness and dementia  Mental status: awake, alert. Oriented to name only.  Cannot give clear or coherent history.  Speech is dysarthric.    Cranial Nerves: I not tested II unable to test visual fields due to patient cooperation. PERRL.  III, IV, VI EOMs intact without ptosis or diplopia  V unreliable but patient reports sensation intact bilaterally  VII facial movements symmetric bilaterally VIII hearing intact to voice/conversation  IX, X no uvula deviation, symmetric rise of soft palate/uvula XI 5/5 SCM and trapezius strength bilaterally  XII tongue  protrusion midline, symmetric L/R movements  Motor: Symmetric hand grip and leg lift bilaterally without obvious deficit.  Initially pt had subtle  right hand weakness with grip but once pulse oximeter was removed she had normal strength in this extremity  Cerebellar: Tremors noted with finger to nose, difficult and unreliable exam due to patient cooperative. Gait not assessed  Psychiatric:     Comments: Intermittently screaming out and moaning during exam but otherwise cooperative and redirectable.     ED Results / Procedures / Treatments   Labs (all labs ordered are listed, but only abnormal results are displayed) Labs Reviewed  CBC WITH DIFFERENTIAL/PLATELET - Abnormal; Notable for the following components:      Result Value   RBC 3.49 (*)    HCT 35.5 (*)    MCV 101.7 (*)    MCH 34.7 (*)    Platelets 142 (*)    Lymphs Abs 0.5 (*)    All other components within normal limits  COMPREHENSIVE METABOLIC PANEL - Abnormal; Notable for the following components:   CO2 21 (*)    Glucose, Bld 166 (*)    Creatinine, Ser 1.15 (*)    ALT 50 (*)    GFR calc non Af Amer 50 (*)    GFR calc Af Amer 58 (*)    All other components within normal limits  CBG MONITORING, ED - Abnormal; Notable for the following components:   Glucose-Capillary 157 (*)    All other components within normal limits  URINALYSIS, ROUTINE W REFLEX MICROSCOPIC    EKG EKG Interpretation  Date/Time:  Friday October 20 2019 10:10:29 EST Ventricular Rate:  82 PR Interval:    QRS Duration: 99 QT Interval:  382 QTC Calculation: 447 R Axis:   86 Text Interpretation: Sinus rhythm Consider right atrial enlargement Borderline right axis deviation Probable anteroseptal infarct, old No previous tracing Confirmed by Gwyneth SproutPlunkett, Whitney ([id number]) on 10/20/2019 11:09:24 AM   Radiology CT Head Wo Contrast  Result Date: 10/20/2019 CLINICAL DATA:  History of fall in late November complicated by development of intracranial hemorrhage. Patient has had persistent slurred speech and unsteady gait since the time of that injury. EXAM: CT HEAD WITHOUT CONTRAST TECHNIQUE: Contiguous  axial images were obtained from the base of the skull through the vertex without intravenous contrast. COMPARISON:  09/14/2019; 09/13/2019 FINDINGS: Brain: Previously noted tiny parenchymal hemorrhage involving the anteromedial aspect the left frontal lobe has resolved in the interval. No new areas of intraparenchymal or extra-axial hemorrhage. Similar findings of atrophy with centralized volume loss and commensurate ex vacuo dilatation of the ventricular system. Unchanged scattered periventricular hypodensities compatible microvascular ischemic disease. No CT evidence of acute large territory infarct. No intraparenchymal or extra-axial mass or hemorrhage. Unchanged size and configuration the ventricles and the basilar cisterns. No midline shift. Vascular: Minimal amount of intracranial atherosclerosis. Skull: No displaced calvarial fracture. Mild bifrontal hyperostosis frontalis. Sinuses/Orbits: Limited visualization of the paranasal sinuses and mastoid air cells is normal. No air-fluid levels. Other: Near completely resolved hematoma about the lateral aspect of the left frontal calvarium (representative image 23, series 3). IMPRESSION: 1. Interval resolution of previously noted tiny intraparenchymal hemorrhage within the anteromedial aspect of the left frontal lobe. 2. Otherwise, similar findings of centralized atrophy without superimposed acute intracranial process. 3. Nearly completely resolved left frontal scalp hematoma. Electronically Signed   By: Simonne ComeJohn  Watts M.D.   On: 10/20/2019 13:17   DG Chest Portable 1 View  Result Date: 10/20/2019 CLINICAL DATA:  Labored breathing EXAM:  PORTABLE CHEST 1 VIEW COMPARISON:  09/13/2019 FINDINGS: Stable cardiomediastinal contours. Mildly elevated right hemidiaphragm, unchanged. No focal airspace consolidation, no pleural effusion, no pneumothorax. Patient's chin partially obscures the right lung apex. Surgical clips in the bilateral axillary regions. Partially visualized  sclerotic lesion in the proximal right humeral diaphysis, as seen on prior films. IMPRESSION: No acute cardiopulmonary findings. Electronically Signed   By: Duanne Guess D.O.   On: 10/20/2019 11:29    Procedures Procedures (including critical care time)  Medications Ordered in ED Medications - No data to display  ED Course  I have reviewed the triage vital signs and the nursing notes.  Pertinent labs & imaging results that were available during my care of the patient were reviewed by me and considered in my medical decision making (see chart for details).  Clinical Course as of Oct 19 1600  Caleen Essex Oct 20, 2019  1503 IMPRESSION: 1. Interval resolution of previously noted tiny intraparenchymal hemorrhage within the anteromedial aspect of the left frontal lobe. 2. Otherwise, similar findings of centralized atrophy without superimposed acute intracranial process. 3. Nearly completely resolved left frontal scalp hematoma.    CT Head Wo Contrast [CG]    Clinical Course User Index [CG] Liberty Handy, PA-C   MDM Rules/Calculators/A&P                      On my initial exam, patient is alone.  She is only oriented to her name.  Follows simple commands unreliably.  Strength and sensation grossly intact. Cranial nerves grossly intact.  She is able to follow simple commands.  Caregiver now at bedside.  Again states no acute changes in mental status in the last couple of days but states things have progressively worsened since the fall 1 month ago including tremors, dysarthric speech, unsteady gait and more confusion.  Your work-up is vastly reassuring.  Head CT shows resolved intracranial bleed.  Labs reassuring.  Patient is tolerating p.o.  Caregiver at bedside states she is at her baseline now.  1600: Patient will be handed off to oncoming ED PA who will follow up with urinalysis, a UTI could be presenting more typically in this patient.  Anticipate discharge.  Patient has PCP  appointment on Monday and psychiatry appointment on Tuesday.  Outpatient team should consider neurology follow-up for intermittent tremors, progressively worsening dementia.  Brother, POA is agreeable with this plan.  Discussed with EDP. Final Clinical Impression(s) / ED Diagnoses Final diagnoses:  Tremor    Rx / DC Orders ED Discharge Orders    None       Liberty Handy, PA-C 10/20/19 1602    Gwyneth Sprout, MD 10/20/19 [phone]

## 2019-10-20 NOTE — ED Triage Notes (Signed)
Per ems, pt from a mental health group home. Pt had a a fall before Thanksgiving and diagnosed with a brain bleed. Since then, pt baseline with slurred speech, unsteady gait since and A&O to self. Pt c/o sob and full body tremors that started today. No seizure hx.   Group home contact [facility] [phone] & [phone]

## 2019-10-20 NOTE — ED Notes (Signed)
Spoke with pt's brother and POA (WIlliam Charland); per Chrissie Noa, pt's dose of Seroquel reduced yesterday from 200 mg to 100 mg  H [phone] C [phone]

## 2019-10-20 NOTE — ED Notes (Signed)
Called [facility] to take patient home--Donna Shepherd

## 2019-10-21 LAB — URINE CULTURE: Culture: <10000 — AB

## 2019-10-25 ENCOUNTER — Encounter: Payer: Self-pay | Admitting: Neurology

## 2019-11-12 ENCOUNTER — Emergency Department (HOSPITAL_BASED_OUTPATIENT_CLINIC_OR_DEPARTMENT_OTHER)
Admission: EM | Admit: 2019-11-12 | Discharge: 2019-11-12 | Disposition: A | Discharge status: Home / Self Care | Payer: Medicare Other | Attending: Emergency Medicine | Admitting: Emergency Medicine

## 2019-11-12 ENCOUNTER — Encounter (HOSPITAL_BASED_OUTPATIENT_CLINIC_OR_DEPARTMENT_OTHER): Payer: Self-pay | Admitting: Emergency Medicine

## 2019-11-12 ENCOUNTER — Other Ambulatory Visit: Payer: Self-pay

## 2019-11-12 DIAGNOSIS — R339 Retention of urine, unspecified: Secondary | ICD-10-CM | POA: Insufficient documentation

## 2019-11-12 DIAGNOSIS — Z79899 Other long term (current) drug therapy: Secondary | ICD-10-CM | POA: Insufficient documentation

## 2019-11-12 DIAGNOSIS — Z7982 Long term (current) use of aspirin: Secondary | ICD-10-CM | POA: Insufficient documentation

## 2019-11-12 DIAGNOSIS — E1122 Type 2 diabetes mellitus with diabetic chronic kidney disease: Secondary | ICD-10-CM | POA: Diagnosis not present

## 2019-11-12 DIAGNOSIS — J45909 Unspecified asthma, uncomplicated: Secondary | ICD-10-CM | POA: Insufficient documentation

## 2019-11-12 DIAGNOSIS — N183 Chronic kidney disease, stage 3 unspecified: Secondary | ICD-10-CM | POA: Diagnosis not present

## 2019-11-12 DIAGNOSIS — I129 Hypertensive chronic kidney disease with stage 1 through stage 4 chronic kidney disease, or unspecified chronic kidney disease: Secondary | ICD-10-CM | POA: Diagnosis not present

## 2019-11-12 DIAGNOSIS — T83021A Displacement of indwelling urethral catheter, initial encounter: Secondary | ICD-10-CM

## 2019-11-12 NOTE — ED Triage Notes (Signed)
Pt here after urinary catheter came out and needs it replaced.

## 2019-11-12 NOTE — ED Provider Notes (Signed)
[facility] EMERGENCY DEPARTMENT Provider Note   CSN: [id number] Arrival date & time: 11/12/19  1734     History Chief Complaint  Patient presents with  . Catheter replacement    Donna Shepherd is a 64 y.o. female.  Patient is a 64 year old female who presents needing a urinary catheter replacement.  She was seen on January 1 and at that time was having urinary retention in the ED and so a Foley catheter was placed.  She has maintained the catheter since that time.  Her caretaker who is with the patient states that she has been back to the doctor a couple times but has not been able to successfully empty her bladder on its own so the catheter has remained in place.  She pulled out the catheter earlier today around 4 PM and they brought her here to have it replaced.  She is otherwise at her baseline mental status per the caregiver.  No fevers or other recent illnesses.        Past Medical History:  Diagnosis Date  . Anxiety   . Asthma   . CKD (chronic kidney disease) stage 3, GFR 30-59 ml/min   . Depressive disorder   . Diabetes mellitus without complication (HCC)   . GERD (gastroesophageal reflux disease)   . Hypertension   . Mixed dyslipidemia   . Osteoporosis   . SUI (stress urinary incontinence, female)   . Vitamin D deficiency     Patient Active Problem List   Diagnosis Date Noted  . 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 BREAST LUMPECTOMY Right   . MASTECTOMY Left      OB History   No obstetric history on file.     Family History  Problem Relation Age of Onset  . Diabetes Mother   . Heart disease Mother   . Hyperlipidemia Mother   . Hypertension Mother   . Depression Father   . Diabetes Father   . Heart disease Father   . Hyperlipidemia Father   . Hypertension Father     Social History   Tobacco Use  . Smoking status: Never Smoker  . Smokeless tobacco: Never Used    Substance Use Topics  . Alcohol use: No  . Drug use: No    Home Medications Prior to Admission medications   Medication Sig Start Date End Date Taking? Authorizing Provider  acetaminophen (TYLENOL) 500 MG tablet Take 500 mg by mouth every 8 (eight) hours as needed for mild pain.    [provider]  albuterol (PROVENTIL HFA;VENTOLIN HFA) 108 (90 Base) MCG/ACT inhaler Inhale 2 puffs into the lungs every 6 (six) hours as needed for wheezing or shortness of breath.     [provider]  alendronate (FOSAMAX) 35 MG tablet Take 35 mg by mouth every 7 (seven) days. Take with a full glass of water on an empty stomach.    [provider]  amantadine (SYMMETREL) 100 MG capsule Take 100 mg by mouth 2 (two) times daily.    [provider]  aspirin 81 MG tablet Take 81 mg by mouth daily.    [provider]  atorvastatin (LIPITOR) 20 MG tablet Take 20 mg by mouth at bedtime. 09/12/19   [provider]  bismuth subsalicylate (PEPTO BISMOL) 262 MG/15ML suspension Take 30 mLs by mouth 4 (four) times daily as needed.    [provider]  CALCIUM-VITAMIN D PO Take 1  tablet by mouth 2 (two) times daily.     [provider]  cholecalciferol (VITAMIN D) 1000 units tablet Take 1,000 Units by mouth daily.    [provider]  clonazePAM (KLONOPIN) 0.5 MG tablet Take 0.5 mg by mouth. Take 1 tab in the morning and 2 tabs at night    [provider]  diltiazem (TIAZAC) 120 MG 24 hr capsule Take 120 mg by mouth daily.    [provider]  diphenhydrAMINE (SOMINEX) 25 MG tablet Take 25-50 mg by mouth 4 (four) times daily as needed for sleep.    [provider]  docusate sodium (COLACE) 100 MG capsule Take 100 mg by mouth 2 (two) times daily.    [provider]  FLUoxetine (PROZAC) 20 MG capsule Take 40 mg by mouth daily.    [provider]  ibuprofen (ADVIL,MOTRIN) 600 MG tablet Take 600 mg by mouth  every 8 (eight) hours as needed.     [provider]  levothyroxine (SYNTHROID, LEVOTHROID) 25 MCG tablet Take 25 mcg by mouth daily before breakfast.    [provider]  linaclotide (LINZESS) 290 MCG CAPS capsule Take 290 mcg by mouth daily before breakfast.    [provider]  loxapine (LOXITANE) 10 MG capsule Take 10 mg by mouth 2 (two) times daily.    [provider]  lubiprostone (AMITIZA) 8 MCG capsule Take 8 mcg by mouth 2 (two) times daily with a meal.    [provider]  magnesium hydroxide (MILK OF MAGNESIA) 400 MG/5ML suspension Take 5 mLs by mouth 4 (four) times daily as needed for mild constipation.    [provider]  meloxicam (MOBIC) 15 MG tablet Take 15 mg by mouth daily as needed.     [provider]  mirabegron ER (MYRBETRIQ) 25 MG TB24 tablet Take 25 mg by mouth daily.    [provider]  nitrofurantoin, macrocrystal-monohydrate, (MACROBID) 100 MG capsule Take 100 mg by mouth daily.    [provider]  Oxcarbazepine (TRILEPTAL) 300 MG tablet Take 300 mg by mouth 2 (two) times daily.     [provider]  Plecanatide (TRULANCE) 3 MG TABS Take 1 tablet by mouth daily.    [provider]  polyethylene glycol (MIRALAX / GLYCOLAX) 17 g packet Take 17 g by mouth 2 (two) times daily as needed.    [provider]  QUEtiapine (SEROQUEL) 100 MG tablet Take 100 mg by mouth 3 (three) times daily. Take 1 tab at 0800, 1 tablet at 1200, and 1 tablet at 1500.    [provider]  QUEtiapine (SEROQUEL) 300 MG tablet Take 600 mg by mouth at bedtime. Take 2 tablets at bedtime at 2000.    [provider]  senna-docusate (SENOKOT-S) 8.6-50 MG tablet Take 2 tablets by mouth 2 (two) times daily as needed for mild constipation.    [provider]  topiramate (TOPAMAX) 25 MG tablet Take 25 mg by mouth 2 (two) times daily.    [provider]  traMADol (ULTRAM) 50 MG  tablet Take 50 mg by mouth every 6 (six) hours as needed.    [provider]  trimethoprim (TRIMPEX) 100 MG tablet Take 100 mg by mouth daily.    [provider]  Wheat Dextrin (BENEFIBER DRINK MIX PO) Take 4 g by mouth daily.    [provider]    Allergies    Codeine, Morphine and related, and Sulfa antibiotics  Review of Systems  Review of Systems  Constitutional: Negative for chills, diaphoresis, fatigue and fever.  HENT: Negative for congestion, rhinorrhea and sneezing.   Eyes: Negative.   Respiratory: Negative for cough, chest tightness and shortness of breath.   Cardiovascular: Negative for chest pain and leg swelling.  Gastrointestinal: Negative for abdominal pain, blood in stool, diarrhea, nausea and vomiting.  Genitourinary: Positive for difficulty urinating. Negative for flank pain, frequency and hematuria.  Musculoskeletal: Negative for arthralgias and back pain.  Skin: Negative for rash.  Neurological: Negative for dizziness, speech difficulty, weakness, numbness and headaches.    Physical Exam Updated Vital Signs BP 135/79   Pulse 79   Temp 98.9 F (37.2 C) (Oral)   Resp 18   SpO2 97%   Physical Exam Constitutional:      Appearance: She is well-developed.  HENT:     Head: Normocephalic and atraumatic.  Eyes:     Pupils: Pupils are equal, round, and reactive to light.  Cardiovascular:     Rate and Rhythm: Normal rate and regular rhythm.     Heart sounds: Normal heart sounds.  Pulmonary:     Effort: Pulmonary effort is normal. No respiratory distress.     Breath sounds: Normal breath sounds. No wheezing or rales.  Chest:     Chest wall: No tenderness.  Abdominal:     General: Bowel sounds are normal.     Palpations: Abdomen is soft.     Tenderness: There is no abdominal tenderness. There is no guarding or rebound.  Musculoskeletal:        General: Normal range of motion.     Cervical back: Normal range of motion and neck  supple.  Lymphadenopathy:     Cervical: No cervical adenopathy.  Skin:    General: Skin is warm and dry.     Findings: No rash.  Neurological:     Mental Status: She is alert.     Comments: Awake and oriented.  She has a tremor present.  She has some difficulty with her speech but reportedly is at baseline per the caretaker who is at bedside.     ED Results / Procedures / Treatments   Labs (all labs ordered are listed, but only abnormal results are displayed) Labs Reviewed - No data to display  EKG None  Radiology No results found.  Procedures Procedures (including critical care time)  Medications Ordered in ED Medications - No data to display  ED Course  I have reviewed the triage vital signs and the nursing notes.  Pertinent labs & imaging results that were available during my care of the patient were reviewed by me and considered in my medical decision making (see chart for details).    MDM Rules/Calculators/A&P                      Patient pulled out her Foley catheter.  This was replaced in the ED.  She denies any change in condition per her caretaker.  She will follow-up with her urologist as directed. Final Clinical Impression(s) / ED Diagnoses Final diagnoses:  Displacement of Foley catheter, initial encounter [facility])    Rx / DC Orders ED Discharge Orders    None       Rolan Bucco, MD 11/12/19 [phone]

## 2019-11-14 NOTE — Progress Notes (Signed)
Donna Shepherd was seen today in the movement disorders clinic for neurologic consultation at the request of Gwyneth Sprout, MD.  The consultation is for the evaluation of tremor.  Medical records have been reviewed, including referral records and records from Dr. Frances Furbish.  Caregiver supplements the history.   Dr. Frances Furbish saw the patient in January, 2018 for parkinsonism.  At the time, is felt she likely had drug-induced symptoms.  No medications were recommended.  It was recommended that she be tapered off of amantadine, but it was recommended she discuss that with her psychiatrist.  More recently, on January 1, the patient was in the emergency room with complaints of tremor.  Records indicate that symptoms had gotten worse after a fall 1 month prior, in which she sustained an intracranial hemorrhage.  The caregiver contacted the psychiatrist who recommended changes in Seroquel from 3 times per day to once per day.  This medication change just took place the last day of December.  Her head CT was repeated on January 1, and I personally reviewed those images.  There was resolution of her previously noted hemorrhage in the left anterior frontal lobe.  Caregiver has been with her for 5 years.  Caregiver states that she has had tremor for years but worse after the fall.  Before the fall, she could get her clothes on and then after the fall, she couldn't.  Once the seroquel was changed, she was able to walk better but still needs some help getting dressed because of urinary cath.  She still has intermittent bad tremors that will last for about 5 mins and she will be awake and alert. She will actually tell herself to calm down.  She has long term hallucinations - that got worse after the fall as well.  Prior exposure to reglan/antipsychotics: Yes.  , on a typical antipsychotic, loxapine and an atypical, quetiapine   ALLERGIES:   Allergies  Allergen Reactions  . Codeine   . Morphine And Related Rash  .  Sulfa Antibiotics Rash    CURRENT MEDICATIONS:  Current Outpatient Medications  Medication Instructions  . acetaminophen (TYLENOL) 500 mg, Oral, Every 8 hours PRN  . albuterol (PROVENTIL HFA;VENTOLIN HFA) 108 (90 Base) MCG/ACT inhaler 2 puffs, Inhalation, Every 6 hours PRN  . alendronate (FOSAMAX) 35 mg, Oral, Every 7 days, Take with a full glass of water on an empty stomach.  Marland Kitchen amantadine (SYMMETREL) 100 mg, Oral, 2 times daily  . aspirin 81 mg, Oral, Daily  . atorvastatin (LIPITOR) 20 mg, Oral, Daily at bedtime  . bismuth subsalicylate (PEPTO BISMOL) 262 MG/15ML suspension 30 mLs, Oral, 4 times daily PRN  . CALCIUM-VITAMIN D PO 1 tablet, Oral, 2 times daily  . cholecalciferol (VITAMIN D) 1,000 Units, Oral, Daily  . clonazePAM (KLONOPIN) 0.5 mg, Oral, Take 1 tab in the morning and 2 tabs at night  . diltiazem (TIAZAC) 120 mg, Oral, Daily  . diphenhydrAMINE (SOMINEX) 25-50 mg, Oral, 4 times daily PRN  . docusate sodium (COLACE) 100 mg, Oral, 2 times daily  . FLUoxetine (PROZAC) 40 mg, Oral, Daily  . ibuprofen (ADVIL) 600 mg, Oral, Every 8 hours PRN  . levothyroxine (SYNTHROID) 25 mcg, Oral, Daily before breakfast  . linaclotide (LINZESS) 290 mcg, Oral, Daily before breakfast  . loxapine (LOXITANE) 10 mg, Oral, 2 times daily  . lubiprostone (AMITIZA) 8 mcg, Oral, 2 times daily with meals  . magnesium hydroxide (MILK OF MAGNESIA) 400 MG/5ML suspension 5 mLs, Oral, 4 times daily  PRN  . meloxicam (MOBIC) 15 mg, Oral, Daily PRN  . mirabegron ER (MYRBETRIQ) 25 mg, Oral, Daily  . nitrofurantoin (macrocrystal-monohydrate) (MACROBID) 100 mg, Oral, Daily  . Oxcarbazepine (TRILEPTAL) 300 mg, Oral, 2 times daily  . Plecanatide (TRULANCE) 3 MG TABS 1 tablet, Oral, Daily  . polyethylene glycol (MIRALAX / GLYCOLAX) 17 g, Oral, 2 times daily PRN  . QUEtiapine (SEROQUEL) 100 mg, Oral, 3 times daily, Take 1 tab at 0800, 1 tablet at 1200, and 1 tablet at 1500.  Marland Kitchen QUEtiapine (SEROQUEL) 600 mg, Oral,  Daily at bedtime, Take 2 tablets at bedtime at 2000.  . senna-docusate (SENOKOT-S) 8.6-50 MG tablet 2 tablets, Oral, 2 times daily PRN  . topiramate (TOPAMAX) 25 mg, Oral, 2 times daily  . traMADol (ULTRAM) 50 mg, Oral, Every 6 hours PRN  . trimethoprim (TRIMPEX) 100 mg, Oral, Daily  . Wheat Dextrin (BENEFIBER DRINK MIX PO) 4 g, Oral, Daily    VITALS:   Vitals:   11/17/19 0957  BP: 128/81  Pulse: 77  Resp: 16  SpO2: 98%    GEN:  The patient appears stated age and is in NAD. HEENT:  Normocephalic, atraumatic.  The mucous membranes are moist. The superficial temporal arteries are without ropiness or tenderness. CV:  RRR Lungs:  CTAB Neck/HEME:  There are no carotid bruits bilaterally.  Neurological examination:  Orientation: The patient is alert.  Looks to caregiver for finer aspects of the history.  Follows simple commands. Cranial nerves: There is good facial symmetry. Extraocular muscles are intact. The visual fields are full to confrontational testing. The speech is fluent and somewhat dysarthric. Soft palate rises symmetrically and there is no tongue deviation. Hearing is intact to conversational tone. Sensation: Sensation is intact to light touch (facial, trunk, extremities).  Motor: Strength is at least antigravity x 4.  Grip strength is good and equal bilaterally.   Movement examination: Tone: There is mild increased tone in the RUE Abnormal movements: there is intermittent R>LUE rest tremor Coordination:  No true decremation Gait and Station: The patient pushes off of the chair.  R leg is externally rotated.  She has start hesitation and the R leg sticks to the ground.  Once in the hall, she walks better with improved stride length.      ASSESSMENT/PLAN:  1.  Parkinsonism  -I had a long counseling session with the patient today.  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  Her Seroquel dosing was decreased recently because of tremor and she has now only on nighttime Seroquel, 600 mg.  I explained that one clinically cannot tell the difference between idiopathic parkinsons disease and secondary parkinsonism from medication.  I also explained that even if one is able to get off of the medication, it can take up to 6 months to clinically definitively know if this is idiopathic parkinsons disease.  It is likely, given her psychiatric history, that she will not be able to get off of medication, and it is possible that others are not appropriate for her.  She can discuss this further with the psychiatrist.  At this point in time, from a pure movement standpoint, there are no other additional medications that I would recommend.  We also discussed that it is certainly possible, and even probable, that her traumatic brain injury resulting in a intracranial hemorrhage could have increased her tremor.  This, also, is getting better with  the course of time.  Anxiety can also increase it.  I think these things will just continue to get better.  Her brain scan does not show any residual hemorrhage at this point in time.  Discussed with patient/caregiver that any medication we would attempt to use for tremor could potentially worsen balance, cognition and worsen hallucinations.  I would not recommend these.  -We did discuss that amantadine can worsen hallucinations.  If possible, I would recommend that this medication be weaned off.   Total time spent on today's visit was 60 minutes, including both face-to-face time and nonface-to-face time.  Time included that spent on review of records (prior notes available to me/labs/imaging if pertinent), discussing treatment and goals, answering patient's questions and coordinating care.  Cc:  Caffie Damme, MD

## 2019-11-17 ENCOUNTER — Other Ambulatory Visit: Payer: Self-pay

## 2019-11-17 ENCOUNTER — Encounter: Payer: Self-pay | Admitting: Neurology

## 2019-11-17 ENCOUNTER — Ambulatory Visit (INDEPENDENT_AMBULATORY_CARE_PROVIDER_SITE_OTHER): Payer: Medicare Other | Admitting: Neurology

## 2019-11-17 VITALS — BP 128/81 | HR 77 | Resp 16

## 2019-11-17 DIAGNOSIS — G2111 Neuroleptic induced parkinsonism: Secondary | ICD-10-CM

## 2019-11-17 DIAGNOSIS — R441 Visual hallucinations: Secondary | ICD-10-CM | POA: Diagnosis not present

## 2019-11-17 DIAGNOSIS — S069X9S Unspecified intracranial injury with loss of consciousness of unspecified duration, sequela: Secondary | ICD-10-CM | POA: Diagnosis not present

## 2019-11-17 NOTE — Patient Instructions (Signed)
It was good to see you today.  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  Your traumatic brain injury certainly could have exacerbated (made worse) the tremor, but I would not recommend medication for it.  Medication would only potentially make hallucinations and confusion worse and could make balance worse.  The physicians and staff at [facility] are committed to providing excellent care. You may receive a survey requesting feedback about your experience at our office. We strive to receive "very good" responses to the survey questions. If you feel that your experience would prevent you from giving the office a "very good " response, please contact our office to try to remedy the situation. We may be reached at [phone]. Thank you for taking the time out of your busy day to complete the survey.

## 2020-02-17 DEATH — deceased

## 2020-11-23 IMAGING — CT CT HEAD W/O CM
3 series · 14 of 47 positions shown, 16 images · non-contrast
Comparison: Radiographs of the cervical spine 08/16/2004

CLINICAL DATA: Head trauma, headache.  Fall.

EXAM:
CT HEAD WITHOUT CONTRAST
CT CERVICAL SPINE WITHOUT CONTRAST
TECHNIQUE: Multidetector CT imaging of the head and cervical spine was
performed following the standard protocol without intravenous
contrast. Multiplanar CT image reconstructions of the cervical spine
were also generated.

[Series 3: head wo · axial · 0.47mm/px · z∈[-78,+47]mm · 8 of 31 slices shown, 10 images]
[im 3/31  brain]
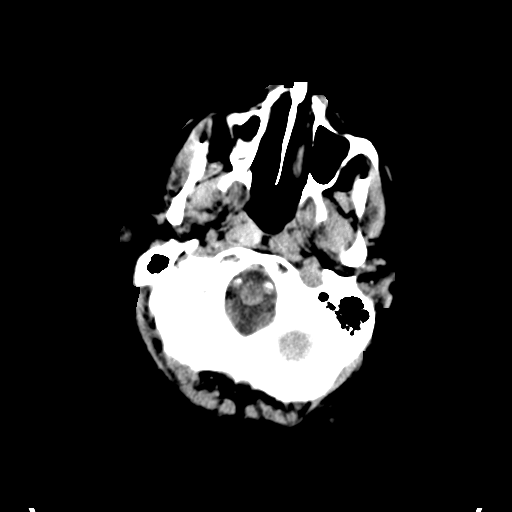
[im 3/31  bone]
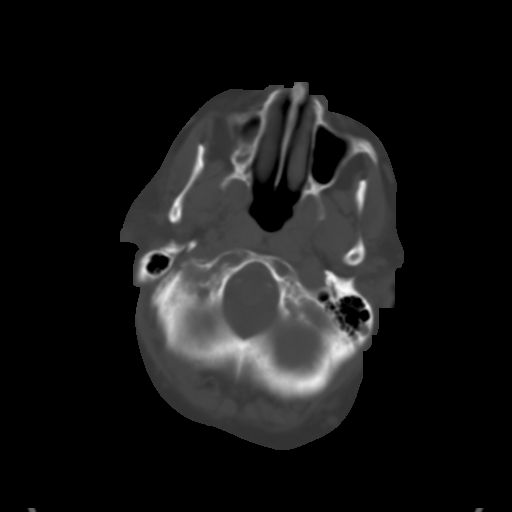
[im 7/31  brain]
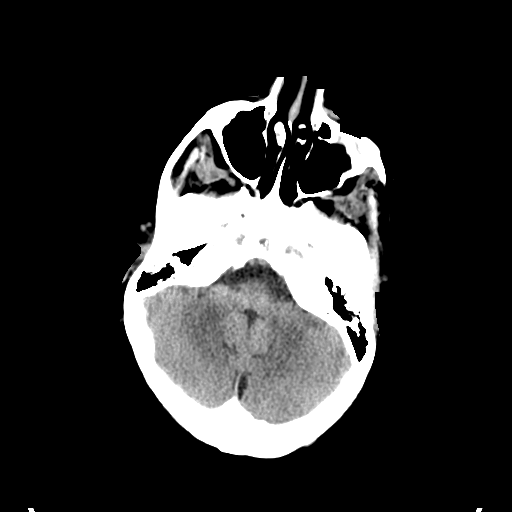
[im 10/31  brain]
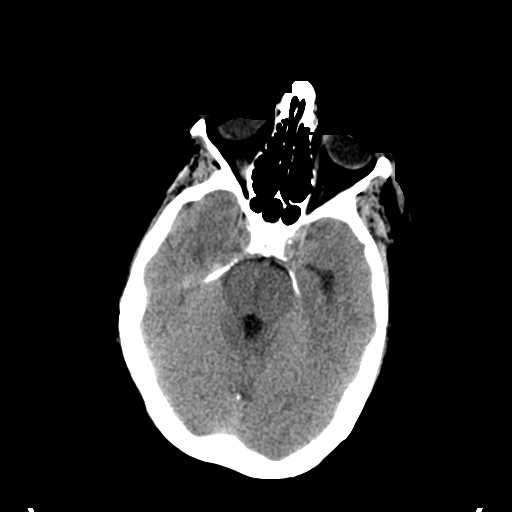
[im 14/31  brain]
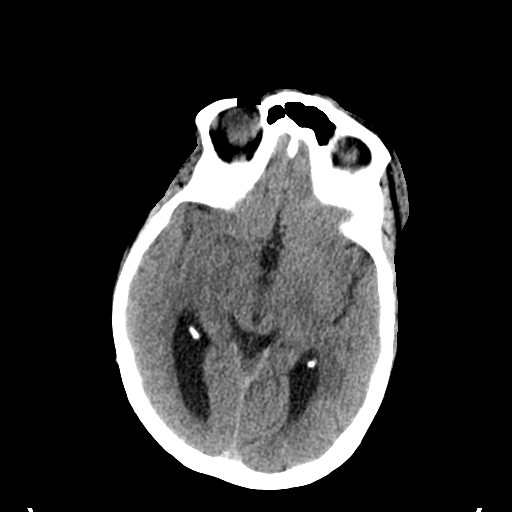
[im 17/31  brain]
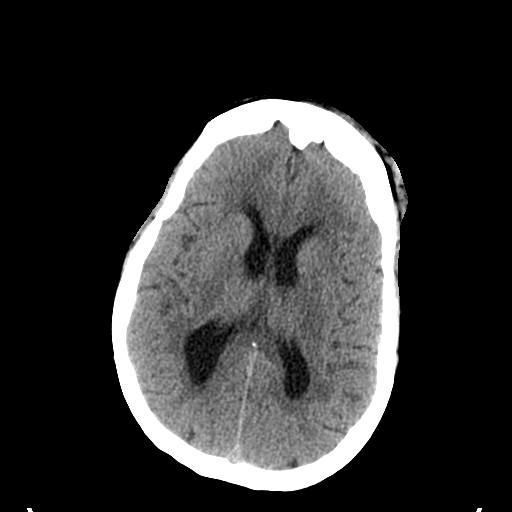
[im 17/31  bone]
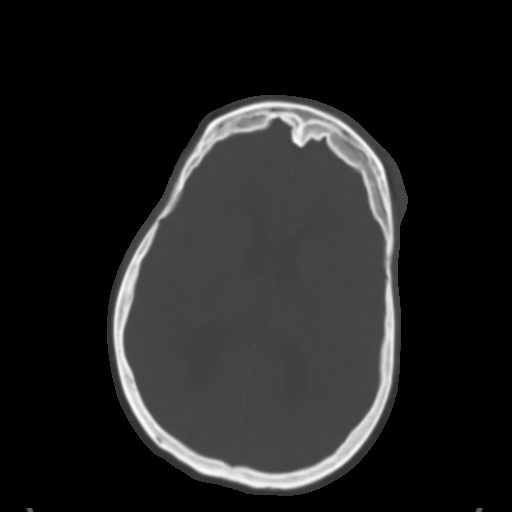
[im 21/31  brain]
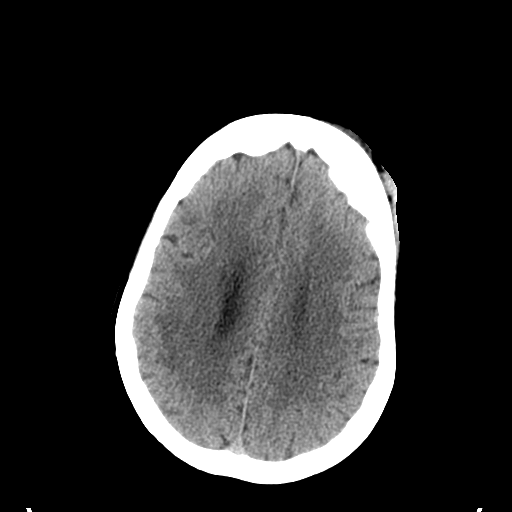
[im 24/31  brain]
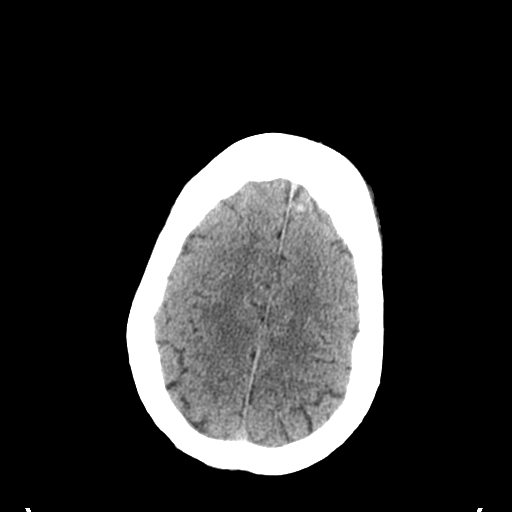
[im 28/31  brain]
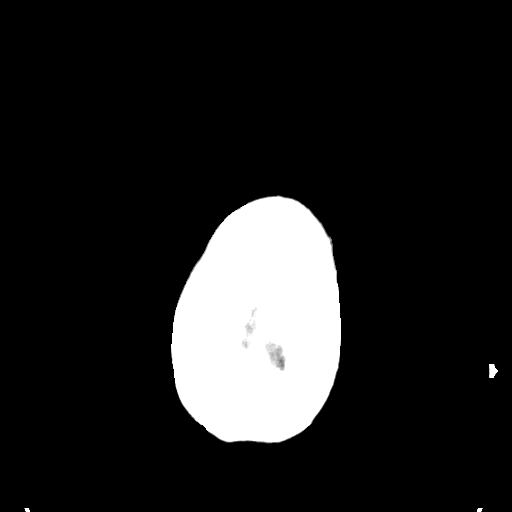

[Series 6: coronal soft tissue · coronal · 0.29mm/px · 3 of 71 slices shown]
[im 24/71  brain]
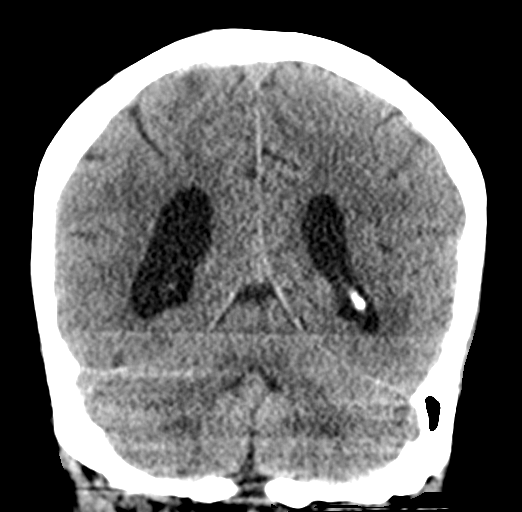
[im 32/71  brain]
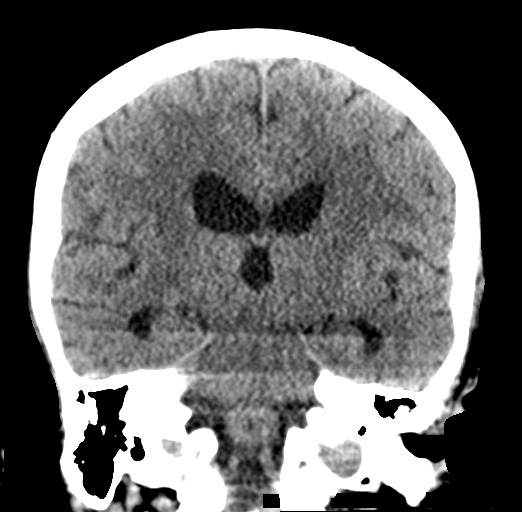
[im 39/71  brain]
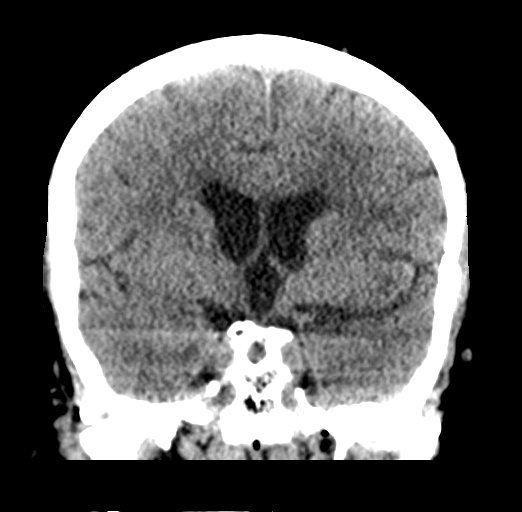

[Series 7: sagittal soft tissue · sagittal · 0.31mm/px · 3 of 53 slices shown]
[im 18/53  brain]
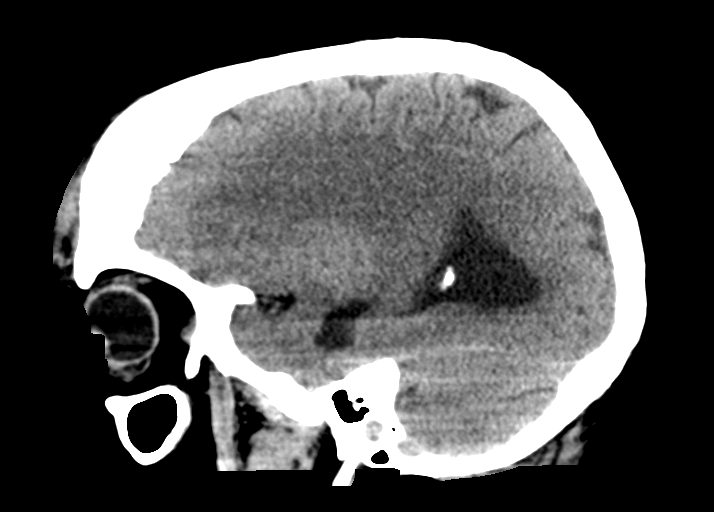
[im 27/53  brain]
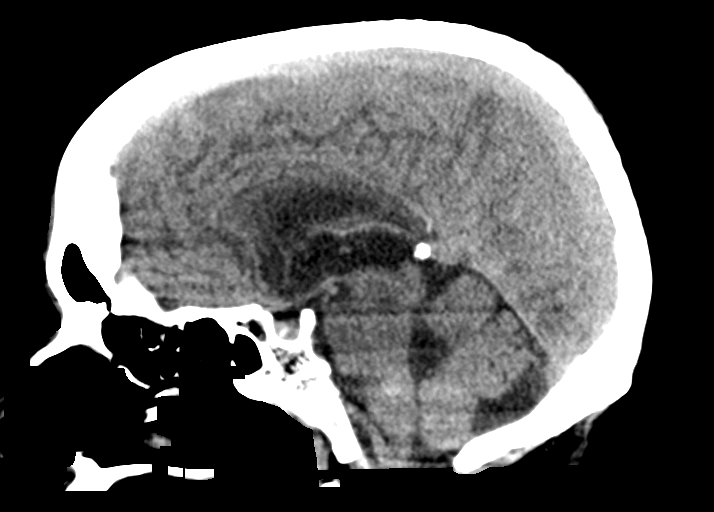
[im 35/53  brain]
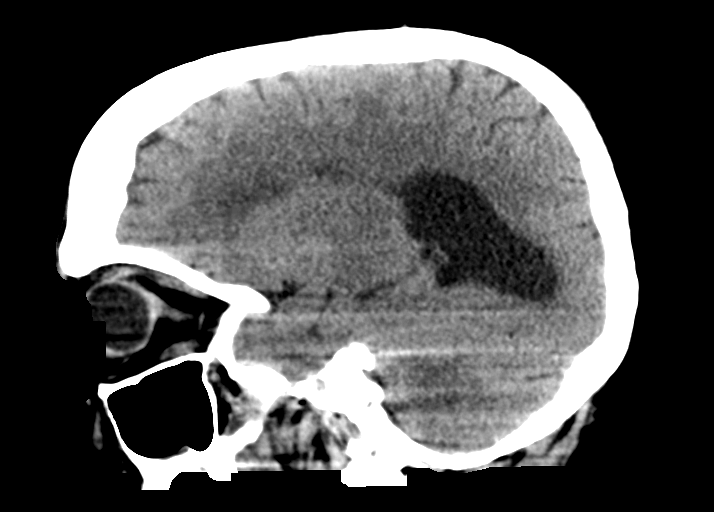

[14 of 47 positions shown; findings below may reference images not displayed]

FINDINGS: CT HEAD FINDINGS

Brain:

There is a 7 mm hemorrhagic parenchymal contusion within the
anterior left frontal lobe (series 3, image 24) (series 6, image
22).

No demarcated cortical infarction.

No evidence of intracranial mass.

No midline shift or extra-axial fluid collection.

Mild scattered ill-defined hypoattenuation within the cerebral white
matter is nonspecific, but consistent with chronic small vessel
ischemic disease.

Cerebral volume is normal for age.

Vascular: No hyperdense vessel.  Atherosclerotic calcifications.

Skull: Normal. Negative for fracture or focal lesion.

Sinuses/Orbits: Visualized orbits demonstrate no acute abnormality.
Minimal mucosal thickening within the inferior right maxillary
sinus.

Other: Prominent left frontal scalp to left maxillofacial hematoma.

CT CERVICAL SPINE FINDINGS

Alignment: Straightening of the expected cervical lordosis.

Skull base and vertebrae: The basion-dental and atlanto-dental
intervals are maintained.No evidence of acute fracture to the
cervical spine.

Soft tissues and spinal canal: No prevertebral fluid or swelling. No
visible canal hematoma.

Disc levels: C6-C7 posterior disc osteophyte. No high-grade bony
spinal canal stenosis at any level.

Upper chest: No consolidation within the imaged lung apices. No
visible pneumothorax.

These results were called by telephone at the time of interpretation
on 09/13/2019 at [DATE] to provider JERZU HAMPEL , who verbally
acknowledged these results.
IMPRESSION: Head CT:

1. 7 mm acute hemorrhagic parenchymal contusion within the anterior
left frontal lobe.
2. Prominent left frontal scalp to left maxillofacial hematoma.
3. Mild chronic small vessel ischemic disease.

Cervical Spine CT:

No evidence of acute fracture to the cervical spine.

## 2020-11-23 IMAGING — CT CT CERVICAL SPINE W/O CM
3 series · 12 of 33 positions shown, 14 images · non-contrast
Comparison: Radiographs of the cervical spine 08/16/2004

CLINICAL DATA: Head trauma, headache.  Fall.

EXAM:
CT HEAD WITHOUT CONTRAST
CT CERVICAL SPINE WITHOUT CONTRAST
TECHNIQUE: Multidetector CT imaging of the head and cervical spine was
performed following the standard protocol without intravenous
contrast. Multiplanar CT image reconstructions of the cervical spine
were also generated.

[Series 4: c spine soft · axial · 0.28mm/px · z∈[-200,-94]mm · 4 of 77 slices shown, 5 images]
[im 12/77  soft-tissue]
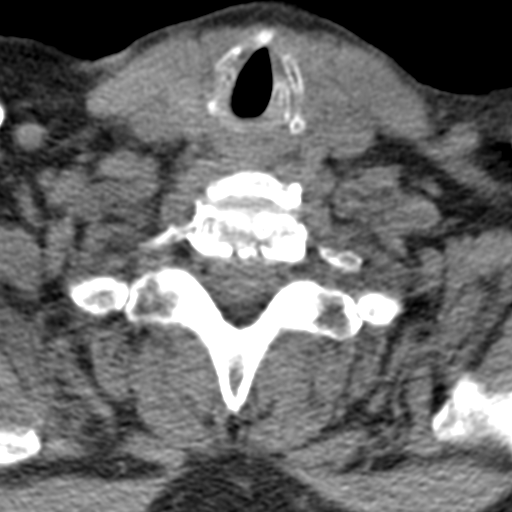
[im 12/77  bone]
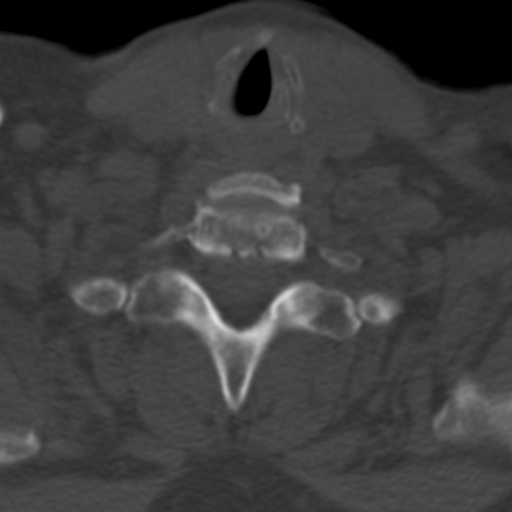
[im 30/77  bone]
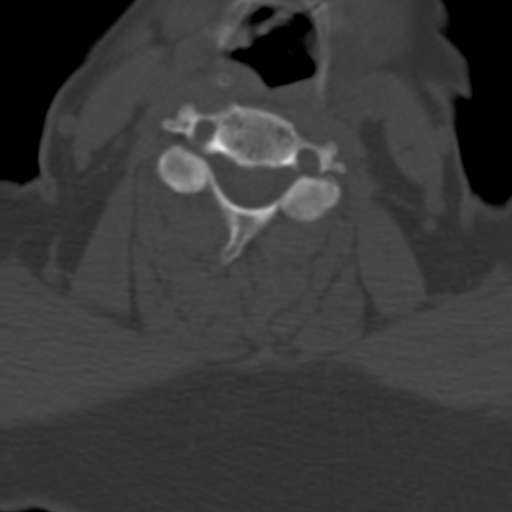
[im 47/77  bone]
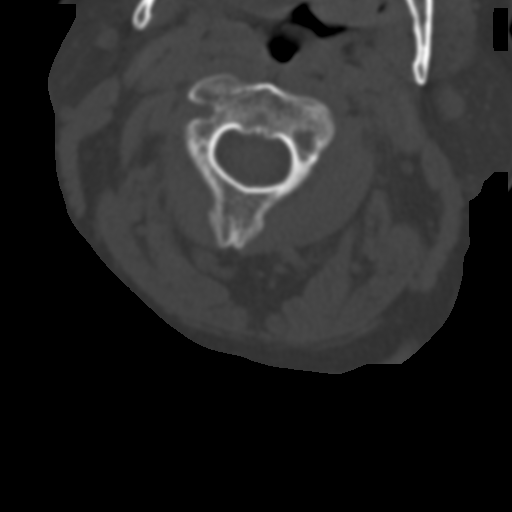
[im 65/77  bone]
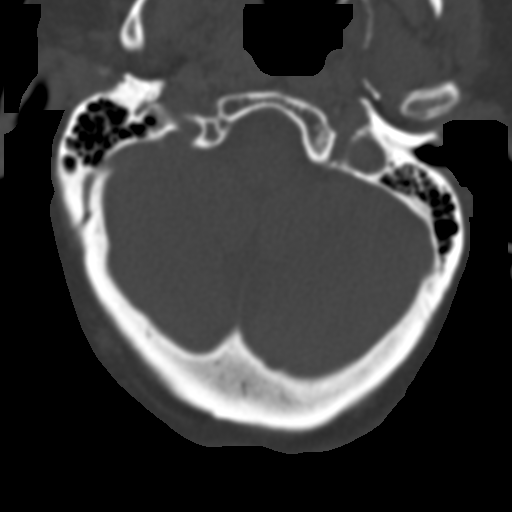

[Series 7: coronal bone · coronal · 0.23mm/px · 3 of 50 slices shown]
[im 10/50  bone]
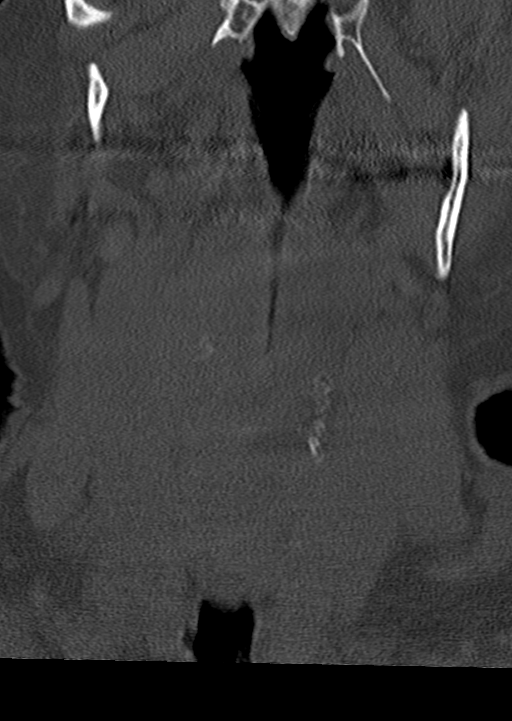
[im 20/50  bone]
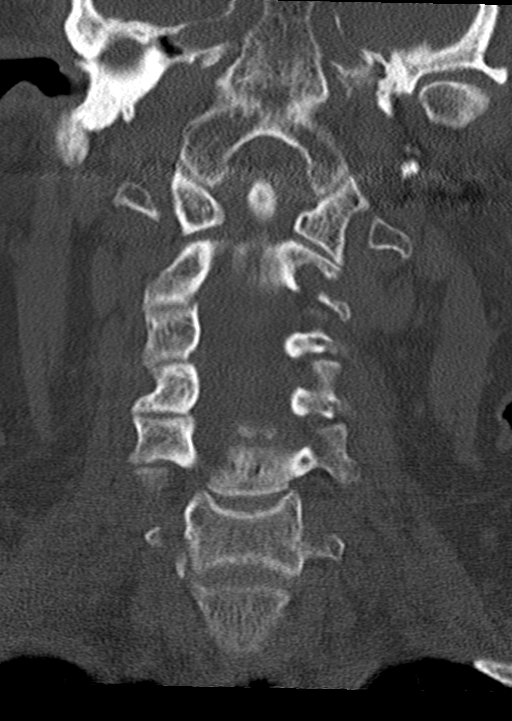
[im 30/50  bone]
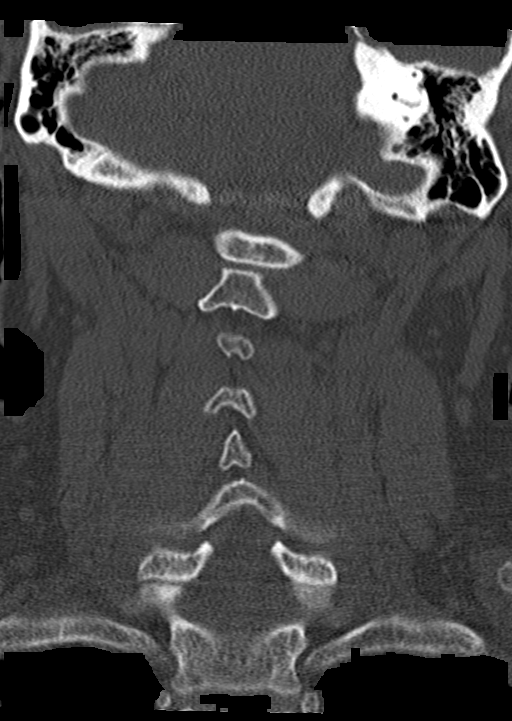

[Series 8: sagittal bone · sagittal · 0.23mm/px · 5 of 61 slices shown, 6 images]
[im 21/61  bone]
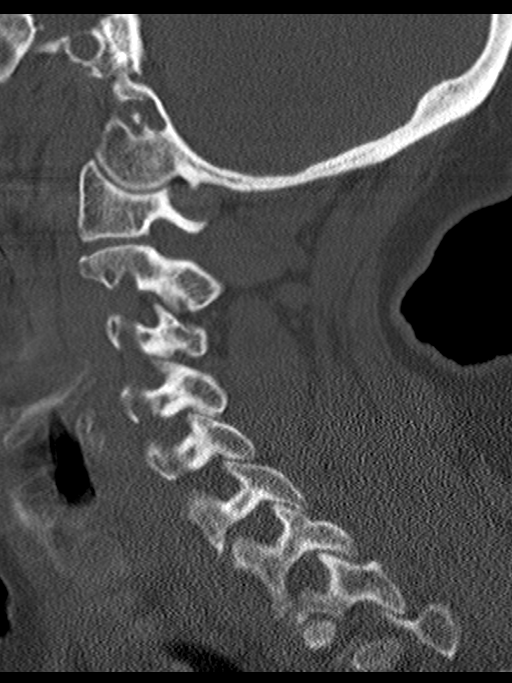
[im 26/61  bone]
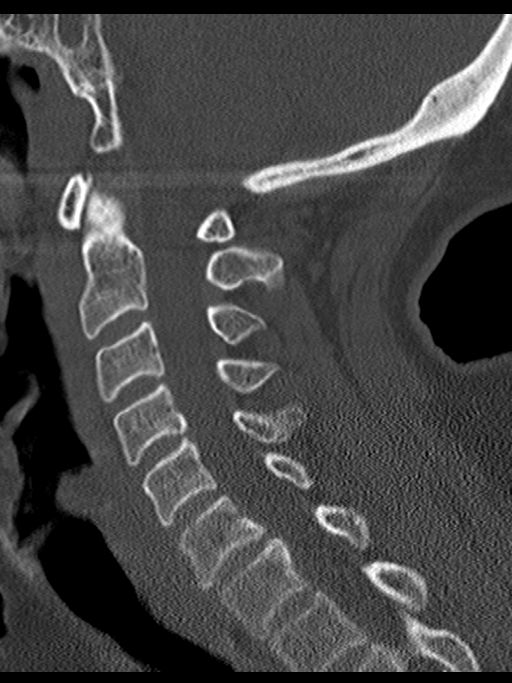
[im 31/61  soft-tissue]
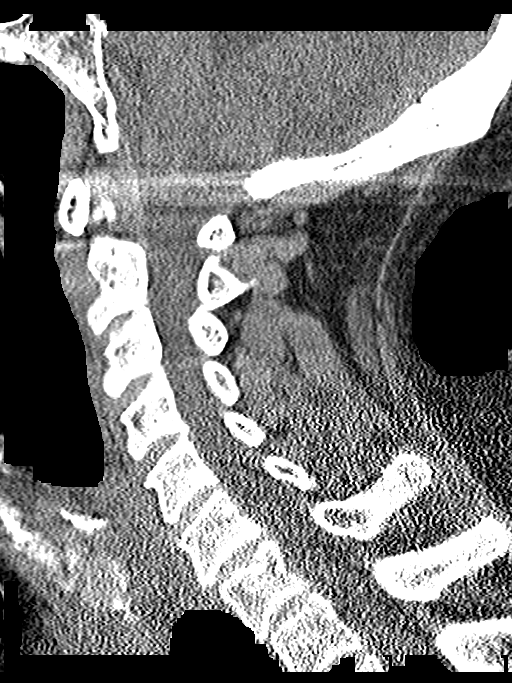
[im 31/61  bone]
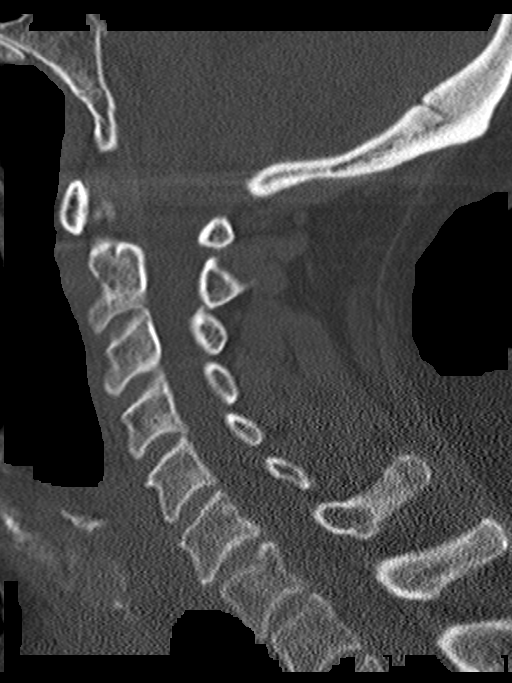
[im 36/61  bone]
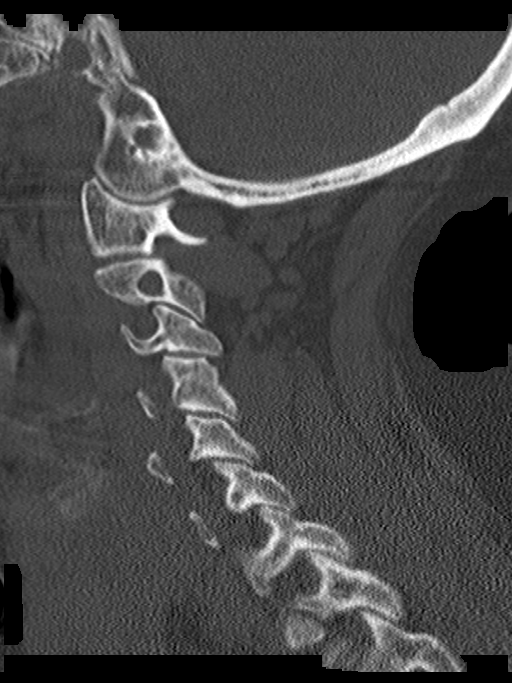
[im 41/61  bone]
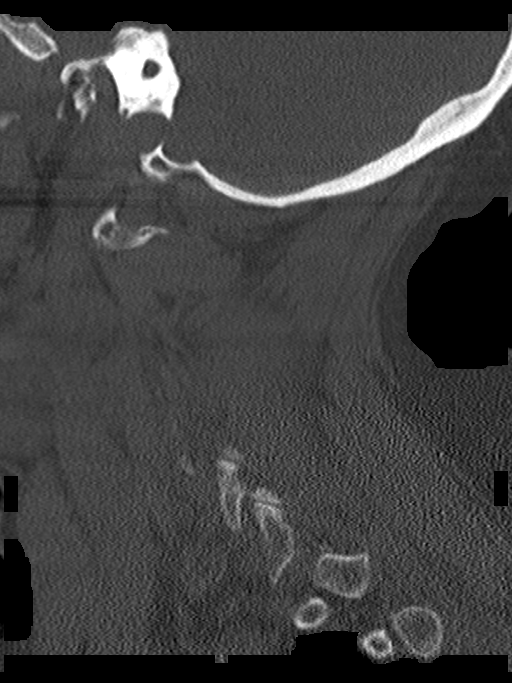

[12 of 33 positions shown; findings below may reference images not displayed]

FINDINGS: CT HEAD FINDINGS

Brain:

There is a 7 mm hemorrhagic parenchymal contusion within the
anterior left frontal lobe (series 3, image 24) (series 6, image
22).

No demarcated cortical infarction.

No evidence of intracranial mass.

No midline shift or extra-axial fluid collection.

Mild scattered ill-defined hypoattenuation within the cerebral white
matter is nonspecific, but consistent with chronic small vessel
ischemic disease.

Cerebral volume is normal for age.

Vascular: No hyperdense vessel.  Atherosclerotic calcifications.

Skull: Normal. Negative for fracture or focal lesion.

Sinuses/Orbits: Visualized orbits demonstrate no acute abnormality.
Minimal mucosal thickening within the inferior right maxillary
sinus.

Other: Prominent left frontal scalp to left maxillofacial hematoma.

CT CERVICAL SPINE FINDINGS

Alignment: Straightening of the expected cervical lordosis.

Skull base and vertebrae: The basion-dental and atlanto-dental
intervals are maintained.No evidence of acute fracture to the
cervical spine.

Soft tissues and spinal canal: No prevertebral fluid or swelling. No
visible canal hematoma.

Disc levels: C6-C7 posterior disc osteophyte. No high-grade bony
spinal canal stenosis at any level.

Upper chest: No consolidation within the imaged lung apices. No
visible pneumothorax.

These results were called by telephone at the time of interpretation
on 09/13/2019 at [DATE] to provider JERZU HAMPEL , who verbally
acknowledged these results.
IMPRESSION: Head CT:

1. 7 mm acute hemorrhagic parenchymal contusion within the anterior
left frontal lobe.
2. Prominent left frontal scalp to left maxillofacial hematoma.
3. Mild chronic small vessel ischemic disease.

Cervical Spine CT:

No evidence of acute fracture to the cervical spine.
